# Patient Record
Sex: Female | Born: 1950 | Hispanic: No | Smoking: Former smoker
Health system: Southern US, Community
[De-identification: ages and names within clinical notes are randomized; demographics above are authoritative.]

## PROBLEM LIST (undated history)

## (undated) DIAGNOSIS — F419 Anxiety disorder, unspecified: Secondary | ICD-10-CM

## (undated) DIAGNOSIS — J449 Chronic obstructive pulmonary disease, unspecified: Secondary | ICD-10-CM

## (undated) DIAGNOSIS — R413 Other amnesia: Secondary | ICD-10-CM

## (undated) DIAGNOSIS — G309 Alzheimer's disease, unspecified: Principal | ICD-10-CM

## (undated) DIAGNOSIS — F0280 Dementia in other diseases classified elsewhere without behavioral disturbance: Principal | ICD-10-CM

## (undated) DIAGNOSIS — I10 Essential (primary) hypertension: Secondary | ICD-10-CM

## (undated) HISTORY — DX: Other amnesia: R41.3

## (undated) HISTORY — DX: Anxiety disorder, unspecified: F41.9

## (undated) HISTORY — PX: OTHER SURGICAL HISTORY: SHX169

## (undated) HISTORY — DX: Essential (primary) hypertension: I10

## (undated) HISTORY — DX: Chronic obstructive pulmonary disease, unspecified: J44.9

## (undated) HISTORY — DX: Dementia in other diseases classified elsewhere without behavioral disturbance: F02.80

## (undated) HISTORY — DX: Alzheimer's disease, unspecified: G30.9

---

## 1998-06-25 ENCOUNTER — Other Ambulatory Visit: Admission: RE | Admit: 1998-06-25 | Discharge: 1998-06-25 | Payer: Self-pay | Admitting: Obstetrics

## 1998-09-05 ENCOUNTER — Emergency Department (HOSPITAL_COMMUNITY): Admission: EM | Admit: 1998-09-05 | Discharge: 1998-09-05 | Payer: Self-pay | Admitting: Emergency Medicine

## 1998-09-05 ENCOUNTER — Encounter: Payer: Self-pay | Admitting: Emergency Medicine

## 1999-11-08 ENCOUNTER — Emergency Department (HOSPITAL_COMMUNITY): Admission: EM | Admit: 1999-11-08 | Discharge: 1999-11-08 | Payer: Self-pay | Admitting: Emergency Medicine

## 2000-09-14 ENCOUNTER — Other Ambulatory Visit: Admission: RE | Admit: 2000-09-14 | Discharge: 2000-09-14 | Payer: Self-pay | Admitting: Obstetrics

## 2000-09-30 ENCOUNTER — Ambulatory Visit (HOSPITAL_COMMUNITY): Admission: RE | Admit: 2000-09-30 | Discharge: 2000-09-30 | Payer: Self-pay | Admitting: Obstetrics

## 2000-09-30 ENCOUNTER — Encounter: Payer: Self-pay | Admitting: Obstetrics

## 2001-10-05 ENCOUNTER — Encounter: Payer: Self-pay | Admitting: Pulmonary Disease

## 2001-10-05 ENCOUNTER — Ambulatory Visit (HOSPITAL_COMMUNITY): Admission: RE | Admit: 2001-10-05 | Discharge: 2001-10-05 | Payer: Self-pay | Admitting: Pulmonary Disease

## 2003-12-09 ENCOUNTER — Emergency Department (HOSPITAL_COMMUNITY): Admission: EM | Admit: 2003-12-09 | Discharge: 2003-12-09 | Payer: Self-pay | Admitting: Family Medicine

## 2004-06-14 ENCOUNTER — Emergency Department (HOSPITAL_COMMUNITY): Admission: EM | Admit: 2004-06-14 | Discharge: 2004-06-14 | Payer: Self-pay | Admitting: Family Medicine

## 2004-08-20 ENCOUNTER — Ambulatory Visit (HOSPITAL_BASED_OUTPATIENT_CLINIC_OR_DEPARTMENT_OTHER): Admission: RE | Admit: 2004-08-20 | Discharge: 2004-08-20 | Payer: Self-pay | Admitting: *Deleted

## 2004-08-20 ENCOUNTER — Ambulatory Visit (HOSPITAL_COMMUNITY): Admission: RE | Admit: 2004-08-20 | Discharge: 2004-08-20 | Payer: Self-pay | Admitting: *Deleted

## 2004-10-15 ENCOUNTER — Ambulatory Visit (HOSPITAL_COMMUNITY): Admission: RE | Admit: 2004-10-15 | Discharge: 2004-10-15 | Payer: Self-pay | Admitting: *Deleted

## 2004-10-15 ENCOUNTER — Ambulatory Visit (HOSPITAL_BASED_OUTPATIENT_CLINIC_OR_DEPARTMENT_OTHER): Admission: RE | Admit: 2004-10-15 | Discharge: 2004-10-15 | Payer: Self-pay | Admitting: *Deleted

## 2004-12-24 ENCOUNTER — Ambulatory Visit (HOSPITAL_COMMUNITY): Admission: RE | Admit: 2004-12-24 | Discharge: 2004-12-24 | Payer: Self-pay | Admitting: Pulmonary Disease

## 2006-04-09 ENCOUNTER — Ambulatory Visit (HOSPITAL_COMMUNITY): Admission: RE | Admit: 2006-04-09 | Discharge: 2006-04-09 | Payer: Self-pay | Admitting: *Deleted

## 2006-04-09 ENCOUNTER — Encounter (INDEPENDENT_AMBULATORY_CARE_PROVIDER_SITE_OTHER): Payer: Self-pay | Admitting: Specialist

## 2006-07-03 ENCOUNTER — Emergency Department (HOSPITAL_COMMUNITY): Admission: EM | Admit: 2006-07-03 | Discharge: 2006-07-03 | Payer: Self-pay | Admitting: Emergency Medicine

## 2006-09-02 ENCOUNTER — Emergency Department (HOSPITAL_COMMUNITY): Admission: EM | Admit: 2006-09-02 | Discharge: 2006-09-02 | Payer: Self-pay | Admitting: Emergency Medicine

## 2008-12-04 ENCOUNTER — Ambulatory Visit (HOSPITAL_COMMUNITY): Admission: RE | Admit: 2008-12-04 | Discharge: 2008-12-04 | Payer: Self-pay | Admitting: Pulmonary Disease

## 2009-10-21 ENCOUNTER — Ambulatory Visit: Payer: Self-pay | Admitting: Internal Medicine

## 2009-10-21 ENCOUNTER — Encounter (INDEPENDENT_AMBULATORY_CARE_PROVIDER_SITE_OTHER): Payer: Self-pay | Admitting: Family Medicine

## 2009-10-21 LAB — CONVERTED CEMR LAB
Alkaline Phosphatase: 60 units/L (ref 39–117)
Basophils Relative: 0 % (ref 0–1)
Creatinine, Ser: 0.72 mg/dL (ref 0.40–1.20)
Eosinophils Absolute: 0 10*3/uL (ref 0.0–0.7)
Eosinophils Relative: 0 % (ref 0–5)
HCT: 38.7 % (ref 36.0–46.0)
Hemoglobin: 12.3 g/dL (ref 12.0–15.0)
Lymphocytes Relative: 48 % — ABNORMAL HIGH (ref 12–46)
MCV: 92.1 fL (ref 78.0–100.0)
Monocytes Relative: 6 % (ref 3–12)
Neutrophils Relative %: 45 % (ref 43–77)
Potassium: 3.7 meq/L (ref 3.5–5.3)
RBC: 4.2 M/uL (ref 3.87–5.11)
RDW: 13.9 % (ref 11.5–15.5)
Sodium: 141 meq/L (ref 135–145)
Total Protein: 7.8 g/dL (ref 6.0–8.3)

## 2009-10-31 ENCOUNTER — Ambulatory Visit (HOSPITAL_COMMUNITY): Admission: RE | Admit: 2009-10-31 | Discharge: 2009-10-31 | Payer: Self-pay | Admitting: Family Medicine

## 2009-11-15 ENCOUNTER — Ambulatory Visit: Payer: Self-pay | Admitting: Internal Medicine

## 2009-11-21 ENCOUNTER — Ambulatory Visit: Payer: Self-pay | Admitting: Internal Medicine

## 2010-01-28 ENCOUNTER — Ambulatory Visit: Payer: Self-pay | Admitting: Internal Medicine

## 2010-02-11 ENCOUNTER — Ambulatory Visit (HOSPITAL_COMMUNITY): Admission: RE | Admit: 2010-02-11 | Discharge: 2010-02-11 | Payer: Self-pay | Admitting: Internal Medicine

## 2010-05-26 ENCOUNTER — Encounter: Payer: Self-pay | Admitting: Pulmonary Disease

## 2010-05-29 ENCOUNTER — Encounter (INDEPENDENT_AMBULATORY_CARE_PROVIDER_SITE_OTHER): Payer: Self-pay | Admitting: Family Medicine

## 2010-05-29 LAB — CONVERTED CEMR LAB
ALT: 26 units/L (ref 0–35)
AST: 38 units/L — ABNORMAL HIGH (ref 0–37)
Albumin: 4.6 g/dL (ref 3.5–5.2)
BUN: 9 mg/dL (ref 6–23)
Calcium: 9.9 mg/dL (ref 8.4–10.5)
Cholesterol: 181 mg/dL (ref 0–200)
HDL: 73 mg/dL (ref >39)
LDL Cholesterol: 96 mg/dL (ref 0–99)
Triglycerides: 61 mg/dL (ref <150)
VLDL: 12 mg/dL (ref 0–40)

## 2010-09-19 NOTE — Op Note (Signed)
NAMESHATASIA, CUTSHAW                  ACCOUNT NO.:  [id number]   MEDICAL RECORD NO.:  [id number]          PATIENT TYPE:  OUT   LOCATION:  DFTL                         FACILITY:  [facility]   PHYSICIAN:  Tennis Must Meyerdierks, M.D.DATE OF BIRTH:  1950/09/10   DATE OF PROCEDURE:  08/20/2004  DATE OF DISCHARGE:  08/20/2004                                 OPERATIVE REPORT   PREOPERATIVE DIAGNOSIS:  Right carpal tunnel syndrome.   POSTOPERATIVE DIAGNOSIS:  Right carpal tunnel syndrome.   PROCEDURE:  Decompression median nerve, right carpal tunnel.   SURGEON:  Lowell Bouton, M.D.   ANESTHESIA:  Half percent Marcaine local with sedation.   OPERATIVE FINDINGS:  The patient had no masses in the carpal canal and the  motor branch of the nerve was intact.   PROCEDURE:  Under 0.5% Marcaine local anesthesia with a tourniquet on the  right arm, the right hand was prepped and draped in usual fashion.  After  exsanguinating the limb, the tourniquet was inflated to 250 mmHg. A 3 cm  longitudinal incision was made in the palm just ulnar to the thenar crease.  Sharp dissection was carried through the subcutaneous tissues. Blunt  dissection was carried through the superficial palmar fascia distal to the  transverse carpal ligament. Hemostat was then placed in the carpal canal up  against the hook of the hamate and the transverse carpal ligament was  divided on the ulnar border of median nerve. The proximal end of the  ligament was divided with the scissors after dissecting the nerve away from  the undersurface of the ligament. The carpal canal was then palpated and was  found to be adequately decompressed.  The nerve was examined and the motor  branch was identified. The wound was then irrigated with saline. The skin  was closed with 4-0 nylon suture. Sterile dressings were applied followed by  a volar wrist splint. The patient tolerated the procedure well and went to  the recovery room awake  and stable in good condition.      EMM/MEDQ  D:  09/12/2004  T:  09/12/2004  Job:  [id number]

## 2010-09-19 NOTE — Op Note (Signed)
[name], TUFT                  ACCOUNT NO.:  [id number]   MEDICAL RECORD NO.:  [id number]          PATIENT TYPE:  AMB   LOCATION:  DSC                          FACILITY:  [facility]   PHYSICIAN:  Lowell Bouton, M.D.DATE OF BIRTH:  03/01/1951   DATE OF PROCEDURE:  10/15/2004  DATE OF DISCHARGE:                                 OPERATIVE REPORT   PREOPERATIVE DIAGNOSIS:  Left carpal tunnel syndrome.   POSTOPERATIVE DIAGNOSIS:  Left carpal tunnel syndrome.   PROCEDURE:  Decompression median nerve, left carpal tunnel.   SURGEON:  Lowell Bouton, M.D.   ANESTHESIA:  0.5% Marcaine local with sedation.   OPERATIVE FINDINGS:  The patient had no masses in the carpal canal. The  motor branch of the nerve was intact.   PROCEDURE:  Under 1/2% Marcaine local anesthesia with a tourniquet on the  left arm, the left hand was prepped and draped in usual fashion.  After  exsanguinating the limb, the tourniquet was inflated to 250 mmHg. A 3 cm  longitudinal incision was made on the ulnar border of the thenar crease and  sharp dissection carried through the subcutaneous tissues. Blunt dissection  was carried through the superficial palmar fascia distal to the transverse  carpal ligament. Hemostat was then placed in the carpal canal up against the  hook of the hamate and the transverse carpal ligament was divided on the  ulnar border of the median nerve. The proximal end of the ligament was  divided with the scissors after dissecting the nerve away from the  undersurface of the ligament. The carpal canal was then palpated and was  found to be adequately decompressed.  The nerve was identified. The wound  was then irrigated with saline. The skin was closed with 4-0 nylon sutures.  Sterile dressings were applied followed by a volar wrist splint. The patient  tolerated the procedure well and went to the recovery room awake and stable  in good condition.       EMM/MEDQ  D:   10/15/2004  T:  10/15/2004  Job:  [id number]

## 2010-09-19 NOTE — Op Note (Signed)
[name], SLIGHT                  ACCOUNT NO.:  [id number]   MEDICAL RECORD NO.:  [id number]          PATIENT TYPE:  AMB   LOCATION:  ENDO                         FACILITY:  [facility]   PHYSICIAN:  Georgiana Spinner, M.D.    DATE OF BIRTH:  11-07-1950   DATE OF PROCEDURE:  04/09/2006  DATE OF DISCHARGE:                               OPERATIVE REPORT   PROCEDURE:  Colonoscopy.   INDICATIONS:  Colon polyps.   ANESTHESIA:  Demerol 20 mg, Versed 2.5 mg.   PROCEDURE:  With the patient mildly sedated in the left lateral  decubitus position, the Olympus videoscopic colonoscope was inserted  into the rectum and passed under direct vision to the cecum, identified  by ileocecal valve and crow's foot of the cecum, both of which were  photographed.  From this point, the colonoscope was slowly withdrawn  taking circumferential views of colonic mucosa, stopping near the  splenic flexure and the transverse colon where a polyp was seen,  photographed and removed using snare cautery technique in the setting of  20/200 blended current.  The polyp was suctioned through the endoscope  and retrieved.  The endoscope was then further withdrawn taking  circumferential views of remaining colonic mucosa, stopping only in the  rectum which appeared normal on direct and showed hemorrhoids on  retroflexed view.  The endoscope was straightened and withdrawn.  The  patient's vital signs and pulse oximeter remained stable.  The patient  tolerated the procedure well and without apparent complication.   FINDINGS:  Internal hemorrhoids.  Polyp of splenic flexure area.  Await  biopsy report.  The patient will call me for results and follow-up with  me as an outpatient.           ______________________________  Georgiana Spinner, M.D.     GMO/MEDQ  D:  04/09/2006  T:  04/09/2006  Job:  [id number]

## 2010-09-19 NOTE — Op Note (Signed)
[name], [name]                  ACCOUNT NO.:  [id number]   MEDICAL RECORD NO.:  [id number]          PATIENT TYPE:  AMB   LOCATION:  ENDO                         FACILITY:  [facility]   PHYSICIAN:  Georgiana Spinner, M.D.    DATE OF BIRTH:  18-Sep-1950   DATE OF PROCEDURE:  04/09/2006  DATE OF DISCHARGE:                               OPERATIVE REPORT   PROCEDURE:  Upper endoscopy.   ENDOSCOPIST:  Georgiana Spinner, M.D.   INDICATIONS:  History of chronic GERD.   ANESTHESIA:  Demerol 50 mg, Versed 5 mg.   PROCEDURE:  With the patient mildly sedated in the left lateral  decubitus position, the Olympus videoscopic endoscope was inserted in  the mouth and passed under direct vision into the esophagus, which  appeared normal.  There was no evidence of Barrett's esophagus or  esophagitis.  Photographs were taken.  We entered into the stomach;  fundus, body, antrum, duodenal bulb and second portion of duodenum were  visualized and all appeared unremarkable.  Subsequently, the endoscope  was slowly withdrawn, taking circumferential views of duodenal mucosa  until the endoscope had been pulled back into the stomach and placed on  retroflexion to view the stomach from below a loose wrap of the GE  junction around the endoscope was noted.  The endoscope was then  straightened and withdrawn, taking circumferential views of the  remaining gastric and esophageal mucosa.  The patient's vital signs and  pulse oximetry remained stable.  The patient tolerated the procedure  well without apparent complications.   FINDINGS:  Loose wrap of the GE junction around the endoscope, otherwise  an unremarkable examination.   PLAN:  Proceed to colonoscopy.           ______________________________  Georgiana Spinner, M.D.     GMO/MEDQ  D:  04/09/2006  T:  04/09/2006  Job:  [id number]   cc:   Mina Marble, M.D.

## 2010-09-19 NOTE — Op Note (Signed)
[name], [name]                  ACCOUNT NO.:  [id number]   MEDICAL RECORD NO.:  [id number]          PATIENT TYPE:  AMB   LOCATION:  DSC                          FACILITY:  [facility]   PHYSICIAN:  Tennis Must Meyerdierks, M.D.DATE OF BIRTH:  06-06-1950   DATE OF PROCEDURE:  DATE OF DISCHARGE:                                 OPERATIVE REPORT   PREOPERATIVE DIAGNOSIS:  Right carpal tunnel syndrome.   POSTOPERATIVE DIAGNOSIS:  Right carpal tunnel syndrome.   PROCEDURE:  Decompression median nerve right carpal tunnel.   SURGEON:  Lowell Bouton, M.D.   ANESTHESIA:  Marcaine 0.5% local with sedation.   OPERATIVE FINDINGS:  The patient had no masses in the carpal canal. The  motor branch of the nerve was intact.   PROCEDURE:  Under 0.5% Marcaine local anesthesia with a tourniquet on the  right arm, the right hand was prepped and draped in the usual fashion and  after explaining the limb, the tourniquet was inflated to 250 mmHg. A 3 cm  longitudinal incision was made in the palm just ulnar to the thenar crease.  Sharp dissection was carried through the subcutaneous tissues. Blunt  dissection was carried through the superficial palmar fascia distal to the  transverse carpal ligament. Hemostat was then placed in the carpal canal up  against the hook of the hamate and the transverse carpal ligament was  divided on the ulnar border of the median nerve. The proximal end of the  ligament was divided with the scissors after dissecting the nerve away from  the undersurface of the ligament. The carpal canal was then palpated and was  found to be adequately decompressed.  The nerve was examined and the motor  branch was identified. The wound was then irrigated with saline. The skin  was closed with 4-0 nylon sutures. Sterile dressings were applied followed  by a volar wrist splint. The patient tolerated the procedure well went to  the recovery room awake and stable condition.      EMM/MEDQ  D:  08/20/2004  T:  08/20/2004  Job:  [id number]   cc:   Eino Farber., M.D.  [address] [place]  [place] [address]  Fax: [phone]

## 2010-12-10 ENCOUNTER — Other Ambulatory Visit: Payer: Self-pay | Admitting: Internal Medicine

## 2010-12-10 DIAGNOSIS — K219 Gastro-esophageal reflux disease without esophagitis: Secondary | ICD-10-CM

## 2010-12-11 ENCOUNTER — Ambulatory Visit
Admission: RE | Admit: 2010-12-11 | Discharge: 2010-12-11 | Disposition: A | Discharge status: Home / Self Care | Payer: Medicare Other | Source: Ambulatory Visit | Attending: Internal Medicine | Admitting: Internal Medicine

## 2010-12-11 DIAGNOSIS — K219 Gastro-esophageal reflux disease without esophagitis: Secondary | ICD-10-CM

## 2010-12-30 ENCOUNTER — Other Ambulatory Visit: Payer: Self-pay | Admitting: Gastroenterology

## 2011-02-02 ENCOUNTER — Other Ambulatory Visit (HOSPITAL_COMMUNITY): Payer: Self-pay | Admitting: Internal Medicine

## 2011-02-02 DIAGNOSIS — Z1231 Encounter for screening mammogram for malignant neoplasm of breast: Secondary | ICD-10-CM

## 2011-02-13 ENCOUNTER — Ambulatory Visit (HOSPITAL_COMMUNITY)
Admission: RE | Admit: 2011-02-13 | Discharge: 2011-02-13 | Disposition: A | Discharge status: Home / Self Care | Payer: Medicare Other | Source: Ambulatory Visit | Attending: Internal Medicine | Admitting: Internal Medicine

## 2011-02-13 DIAGNOSIS — Z1231 Encounter for screening mammogram for malignant neoplasm of breast: Secondary | ICD-10-CM | POA: Insufficient documentation

## 2012-01-06 ENCOUNTER — Other Ambulatory Visit (HOSPITAL_COMMUNITY): Payer: Self-pay | Admitting: Internal Medicine

## 2012-01-06 DIAGNOSIS — Z1231 Encounter for screening mammogram for malignant neoplasm of breast: Secondary | ICD-10-CM

## 2012-01-08 ENCOUNTER — Other Ambulatory Visit: Payer: Self-pay | Admitting: Neurology

## 2012-01-08 DIAGNOSIS — F09 Unspecified mental disorder due to known physiological condition: Secondary | ICD-10-CM

## 2012-01-08 DIAGNOSIS — R6889 Other general symptoms and signs: Secondary | ICD-10-CM

## 2012-02-05 ENCOUNTER — Ambulatory Visit
Admission: RE | Admit: 2012-02-05 | Discharge: 2012-02-05 | Disposition: A | Discharge status: Home / Self Care | Payer: Medicare Other | Source: Ambulatory Visit | Attending: Neurology | Admitting: Neurology

## 2012-02-05 DIAGNOSIS — R6889 Other general symptoms and signs: Secondary | ICD-10-CM

## 2012-02-05 DIAGNOSIS — F09 Unspecified mental disorder due to known physiological condition: Secondary | ICD-10-CM

## 2012-02-17 ENCOUNTER — Ambulatory Visit (HOSPITAL_COMMUNITY)
Admission: RE | Admit: 2012-02-17 | Discharge: 2012-02-17 | Disposition: A | Discharge status: Home / Self Care | Payer: Medicare Other | Source: Ambulatory Visit | Attending: Internal Medicine | Admitting: Internal Medicine

## 2012-02-17 DIAGNOSIS — Z1231 Encounter for screening mammogram for malignant neoplasm of breast: Secondary | ICD-10-CM | POA: Insufficient documentation

## 2012-12-23 ENCOUNTER — Ambulatory Visit: Payer: Self-pay | Admitting: Nurse Practitioner

## 2012-12-24 ENCOUNTER — Encounter: Payer: Self-pay | Admitting: Nurse Practitioner

## 2012-12-26 ENCOUNTER — Ambulatory Visit (INDEPENDENT_AMBULATORY_CARE_PROVIDER_SITE_OTHER): Payer: Medicare Other | Admitting: Nurse Practitioner

## 2012-12-26 ENCOUNTER — Encounter: Payer: Self-pay | Admitting: Nurse Practitioner

## 2012-12-26 VITALS — BP 146/73 | HR 67 | Ht 65.0 in | Wt 168.0 lb

## 2012-12-26 DIAGNOSIS — F079 Unspecified personality and behavioral disorder due to known physiological condition: Secondary | ICD-10-CM

## 2012-12-26 NOTE — Patient Instructions (Addendum)
Continue Aricept 10 mg daily  Follow up in 6 months

## 2012-12-26 NOTE — Progress Notes (Signed)
Reason for visit follows up for memory loss HPI:  [name] is a 62 year old right-handed black female with a four year history of a progressive memory decline. Three years ago, the patient began having problems with getting lost while driving, and she had to give up operating a motor vehicle. The patient also began having problems with paying her bills, and her son had to take this task over. Since that time, the patient has moved into an independent living facility. The patient takes care of her autistic son. The patient has had increasing problems with managing day-to-day activities. The patient requires assistance to keep up with medications, and appointments. The son has to call every 3 hours to check on her and to get her to take her medications.  The patient notes some mild gait instability, with 1 fall in last 6 months. The patient does not use a cane for ambulation. The patient does not report headaches or visual field changes. The patient has been placed on Aricept without GI complaints Thyroid studies and vitamin B12 levels are unremarkable. Son says he thinks her memory has stabilized for now. He takes care of her finances. She requires assistance to keep up with medications, and appointments.No focal weakness, no problems controlling the bowels , has urinary incontinance.  CT of the head 02/05/12 was normal. Patient does not drive. Appetite is good she receives Meals on Wheels. Sleeping well at night without behavior issues or wandering.    ROS:  Memory loss, urinary incontinence, allergies  Medications Current Outpatient Prescriptions on File Prior to Visit  Medication Sig Dispense Refill  . albuterol (PROAIR HFA) 108 (90 BASE) MCG/ACT inhaler Inhale 2 puffs into the lungs every 6 (six) hours as needed for wheezing.      Marland Kitchen albuterol (PROVENTIL) (2.5 MG/3ML) 0.083% nebulizer solution Take 2.5 mg by nebulization every 6 (six) hours as needed for wheezing.      . Cetirizine HCl (ZYRTEC  ALLERGY) 10 MG CAPS Take 10 mg by mouth daily.      Marland Kitchen dexlansoprazole (DEXILANT) 60 MG capsule Take 60 mg by mouth daily.      Marland Kitchen dicyclomine (BENTYL) 20 MG tablet Take 20 mg by mouth 3 (three) times daily.      Marland Kitchen donepezil (ARICEPT) 10 MG tablet Take 10 mg by mouth daily.      . Ipratropium-Albuterol (COMBIVENT RESPIMAT) 20-100 MCG/ACT AERS respimat Inhale 2 puffs into the lungs every 6 (six) hours.      . sucralfate (CARAFATE) 1 G tablet Take 1 g by mouth 3 (three) times daily.      . traMADol (ULTRAM) 50 MG tablet Take 50 mg by mouth every 6 (six) hours as needed for pain.       No current facility-administered medications on file prior to visit.    Allergies No Known Allergies  Physical Exam General: well developed, well nourished, seated, in no evident distress  Neurologic Exam Mental Status: Awake and  alert. MMSE 16/30.AFT 4.  Follows all commands. Speech and language normal.   Cranial Nerves: Fundoscopic exam reveals flat  discs. Pupils equal, briskly reactive to light. Extraocular movements full without nystagmus. Visual fields full to confrontation. Hearing intact and symmetric to finger snap. Facial sensation intact. Face, tongue, palate move normally and symmetrically. Neck flexion and extension normal.  Motor: Normal bulk and tone. Normal strength in all tested extremity muscles.No focal weakness Sensory.: intact to touch and pinprick and vibratory.  Coordination: Clumsy finger to nose and heel to  shin bilaterally. Some apraxia of the lower extremities  Gait and Station: Arises from chair without difficulty. Stance is normal.  Able to heel, toe and mildly unsteady with tandem walk.  Reflexes: 2+ and symmetric. Toes downgoing.     ASSESSMENT: Progressive dementing illness currently  on Aricept without side effects     PLAN: Continue Aricept 10 mg daily, does not need refills according to son Followup in 6 months  [name], [facility] APRN

## 2012-12-26 NOTE — Progress Notes (Signed)
I have read the note, and I agree with the clinical assessment and plan.  [name],[name]   

## 2013-01-18 ENCOUNTER — Other Ambulatory Visit (HOSPITAL_COMMUNITY): Payer: Self-pay | Admitting: Internal Medicine

## 2013-01-18 DIAGNOSIS — Z1231 Encounter for screening mammogram for malignant neoplasm of breast: Secondary | ICD-10-CM

## 2013-02-20 ENCOUNTER — Ambulatory Visit (HOSPITAL_COMMUNITY)
Admission: RE | Admit: 2013-02-20 | Discharge: 2013-02-20 | Disposition: A | Discharge status: Home / Self Care | Payer: Medicare Other | Source: Ambulatory Visit | Attending: Internal Medicine | Admitting: Internal Medicine

## 2013-02-20 DIAGNOSIS — Z1231 Encounter for screening mammogram for malignant neoplasm of breast: Secondary | ICD-10-CM | POA: Insufficient documentation

## 2013-06-28 ENCOUNTER — Ambulatory Visit (INDEPENDENT_AMBULATORY_CARE_PROVIDER_SITE_OTHER): Payer: Medicare Other | Admitting: Nurse Practitioner

## 2013-06-28 ENCOUNTER — Encounter (INDEPENDENT_AMBULATORY_CARE_PROVIDER_SITE_OTHER): Payer: Self-pay

## 2013-06-28 ENCOUNTER — Encounter: Payer: Self-pay | Admitting: Nurse Practitioner

## 2013-06-28 VITALS — BP 160/86 | HR 76 | Ht 65.0 in | Wt 162.0 lb

## 2013-06-28 DIAGNOSIS — F079 Unspecified personality and behavioral disorder due to known physiological condition: Secondary | ICD-10-CM

## 2013-06-28 NOTE — Progress Notes (Signed)
I have read the note, and I agree with the clinical assessment and plan.  WILLIS,CHARLES KEITH   

## 2013-06-28 NOTE — Patient Instructions (Signed)
Continue Aricept 10mg  daily Follow up in 6 to 8 months

## 2013-06-28 NOTE — Progress Notes (Signed)
[facility]  PATIENT: [name] DOB: [birth year]   REASON FOR VISIT: Followup for memory loss   HISTORY OF PRESENT ILLNESS: [name], 63 year old female returns for followup with her son. She has dementia for 4-1/2 years. She can no longer pay her bills, get her meals, management of day-to-day activities. She requires assistance with medication administration. There is no focal weakness, no difficulty controlling the bowels however she does have urinary incontinence. She receives Meals on Wheels. There have been no behavior issues or wandering behavior according to the son. She sleeps well at night. She is currently on Aricept without side effects. She returns for reevaluation.   HISTORY: four year history of a progressive memory decline. Three years ago, the patient began having problems with getting lost while driving, and she had to give up operating a motor vehicle. The patient also began having problems with paying her bills, and her son had to take this task over. Since that time, the patient has moved into an independent living facility. The patient takes care of her autistic son. The patient has had increasing problems with managing day-to-day activities. The patient requires assistance to keep up with medications, and appointments. The son has to call every 3 hours to check on her and to get her to take her medications. The patient notes some mild gait instability, with 1 fall in last 6 months. The patient does not use a cane for ambulation. The patient does not report headaches or visual field changes. The patient has been placed on Aricept without GI complaints Thyroid studies and vitamin B12 levels are unremarkable.  Son says he thinks her memory has stabilized for now. He takes care of her finances. She requires assistance to keep up with medications, and appointments.No focal weakness, no problems controlling the bowels , has urinary incontinance. CT of the head  02/05/12 was normal. Patient does not drive. Appetite is good she receives Meals on Wheels. Sleeping well at night without behavior issues or wandering.      REVIEW OF SYSTEMS: Full 14 system review of systems performed and notable only for those listed, all others are neg:  Constitutional: N/A  Cardiovascular: N/A  Ear/Nose/Throat: N/A  Skin: N/A  Eyes: N/A  Respiratory: N/A  Gastroitestinal: N/A  Hematology/Lymphatic: N/A  Endocrine: N/A Musculoskeletal: 1 fall in 6 months Allergy/Immunology: N/A  Neurological: Memory loss Psychiatric: Confusion   ALLERGIES: No Known Allergies  HOME MEDICATIONS: Outpatient Prescriptions Prior to Visit  Medication Sig Dispense Refill  . albuterol (PROAIR HFA) 108 (90 BASE) MCG/ACT inhaler Inhale 2 puffs into the lungs every 6 (six) hours as needed for wheezing.      Marland Kitchen albuterol (PROVENTIL) (2.5 MG/3ML) 0.083% nebulizer solution Take 2.5 mg by nebulization every 6 (six) hours as needed for wheezing.      . Cetirizine HCl (ZYRTEC ALLERGY) 10 MG CAPS Take 10 mg by mouth daily.      Marland Kitchen dexlansoprazole (DEXILANT) 60 MG capsule Take 60 mg by mouth daily.      Marland Kitchen dicyclomine (BENTYL) 20 MG tablet Take 20 mg by mouth 3 (three) times daily.      Marland Kitchen donepezil (ARICEPT) 10 MG tablet Take 10 mg by mouth daily.      . Ipratropium-Albuterol (COMBIVENT RESPIMAT) 20-100 MCG/ACT AERS respimat Inhale 2 puffs into the lungs every 6 (six) hours.      . sucralfate (CARAFATE) 1 G tablet Take 1 g by mouth 3 (three) times daily.      Marland Kitchen  traMADol (ULTRAM) 50 MG tablet Take 50 mg by mouth every 6 (six) hours as needed for pain.       No facility-administered medications prior to visit.    PAST MEDICAL HISTORY: Past Medical History  Diagnosis Date  . Memory disturbance   . Hypertension   . Anxiety disorder   . COPD (chronic obstructive pulmonary disease)     PAST SURGICAL HISTORY: Past Surgical History  Procedure Laterality Date  . None      FAMILY  HISTORY: Family History  Problem Relation Age of Onset  . Alzheimer's disease Brother     SOCIAL HISTORY: History   Social History  . Marital Status: Divorced    Spouse Name: N/A    Number of Children: 2  . Years of Education: GED   Occupational History  . Not on file.   Social History Main Topics  . Smoking status: Former Games developermoker  . Smokeless tobacco: Never Used  . Alcohol Use: No  . Drug Use: No  . Sexual Activity: Not on file   Other Topics Concern  . Not on file   Social History Narrative   Patient is divorced and lives in an independence living with her son who has autism.    Patient has her GED.    Patient has 2 sons.    Patient quit smoking in 1990.           PHYSICAL EXAM  Filed Vitals:   06/28/13 1105  BP: 160/86  Pulse: 76  Height: 5\' 5"  (1.651 m)  Weight: 162 lb (73.483 kg)   Body mass index is 26.96 kg/(m^2).  Generalized: Well developed, in no acute distress   Neurological examination   Mentation: Alert  MMSE 15/30 missing items and orientation, calculation, 3 out of 3 recall, unable to copy a figure or write a sentence. AFT 2. Follows some commands.   Cranial nerve II-XII: Pupils were equal round reactive to light extraocular movements were full, visual field were full on confrontational test. Facial sensation and strength were normal. hearing was intact to finger rubbing bilaterally. Uvula tongue midline. head turning and shoulder shrug were normal and symmetric.Tongue protrusion into cheek strength was normal. Motor: normal bulk and tone, full strength in the BUE, BLE, fine finger movements normal, no pronator drift. No focal weakness Sensory: normal and symmetric to light touch, pinprick, and  vibration  Coordination: Clumsy finger-nose-finger, heel-to-shin bilaterally, some apraxia of the lower extremities noted Reflexes: Brachioradialis 2/2, biceps 2/2, triceps 2/2, patellar 2/2, Achilles 2/2, plantar responses were flexor  bilaterally. Gait and Station: Rising up from seated position without assistance,wide based  smooth turning, unable to perform tiptoe, and heel walk. Tandem gait is unsteady  DIAGNOSTIC DATA (LABS, IMAGING, TESTING) -     ASSESSMENT AND PLAN  63 y.o. year old female  has a past medical history of Memory disturbance; Hypertension; Anxiety disorder; and COPD (chronic obstructive pulmonary disease). here in followup for dementia which is progressive. She is currently on Aricept about side effects  Continue Aricept 10mg  daily, does not need refills according to son Follow up in 6 to 8 months Nilda RiggsNancy Carolyn Jatavion Peaster, [facility] CenterGNP, [facility], APRN  [facility] [address], [address] [place], [place] [address] [phone]

## 2013-12-06 ENCOUNTER — Other Ambulatory Visit (HOSPITAL_COMMUNITY): Payer: Self-pay | Admitting: Family Medicine

## 2013-12-06 DIAGNOSIS — Z1231 Encounter for screening mammogram for malignant neoplasm of breast: Secondary | ICD-10-CM

## 2014-01-01 ENCOUNTER — Ambulatory Visit (INDEPENDENT_AMBULATORY_CARE_PROVIDER_SITE_OTHER): Payer: Medicare (Managed Care) | Admitting: Neurology

## 2014-01-01 ENCOUNTER — Encounter: Payer: Self-pay | Admitting: Neurology

## 2014-01-01 VITALS — BP 151/85 | HR 81 | Wt 155.0 lb

## 2014-01-01 DIAGNOSIS — G309 Alzheimer's disease, unspecified: Principal | ICD-10-CM

## 2014-01-01 DIAGNOSIS — F0280 Dementia in other diseases classified elsewhere without behavioral disturbance: Secondary | ICD-10-CM

## 2014-01-01 HISTORY — DX: Alzheimer's disease, unspecified: F02.80

## 2014-01-01 NOTE — Patient Instructions (Signed)
Alzheimer Disease Caregiver Guide Alzheimer disease is an illness that affects a person's brain. It causes a person to lose the ability to remember things and make good decisions. As the disease progresses, the person is unable to take care of himself or herself and needs more and more help to do simple tasks. Taking care of someone with Alzheimer disease can be very challenging and overwhelming.  MEMORY LOSS AND CONFUSION Memory loss and confusion is mild in the beginning stages of the disease. Both of these problems become more severe as the disease progresses. Eventually, the person will not recognize places or even close family members and friends.   Stay calm.  Respond with a short explanation. Long explanations can be overwhelming and confusing.  Avoid corrections that sound like scolding.  Try not to take it personally, even if the person forgets your name. BEHAVIOR CHANGES Behavior changes are part of the disease. The person may develop depression, anxiety, anger, hallucinations, or other behavior changes. These changes can come on suddenly and may be in response to pain, infection, changes in the environment (temperature, noise), overstimulation, or feeling lost or scared.   Try not to take behavior changes personally.  Remain calm and patient.  Do not argue or try to convince the person about a specific point. This will only make him or her more agitated.  Know that the behavior changes are part of the disease process and try to work through it. TIPS TO REDUCE FRUSTRATION  Schedule wisely by making appointments and doing daily tasks, like bathing and dressing, when the person is at his or her best.  Take your time. Simple tasks may take a lot longer, so be sure to allow for plenty of time.  Limit choices. Too many choices can be overwhelming and stressful for the person.  Involve the person in what you are doing.  Stick to a routine.  Avoid new or crowded situations, if  possible.  Use simple words, short sentences, and a calm voice. Only give one direction at a time.  Buy clothes and shoes that are easy to put on and take off.  Let people help if they offer. HOME SAFETY Keeping the home safe is very important to reduce the risk of falls and injuries.   Keep floors clear of clutter. Remove rugs, magazine racks, and floor lamps.  Keep hallways well lit.  Put a handrail and nonslip mat in the bathtub or shower.  Put childproof locks on cabinets with dangerous items, such as medicine, alcohol, guns, toxic cleaning items, sharp tools or utensils, matches, or lighters.  Place locks on doors where the person cannot easily see or reach them. This helps ensure that the person cannot wander out of the house and get lost.  Be prepared for emergencies. Keep a list of emergency phone numbers and addresses in a convenient area. PLANS FOR THE FUTURE  Do not put off talking about finances.  Talk about money management. People with Alzheimer disease have trouble managing their money as the disease gets worse.  Get help from professional advisors regarding financial and legal matters.  Do not put off talking about future care.  Choose a power of attorney. This is someone who can make decisions for the person with Alzheimer disease when he or she is no longer able to do so.  Talk about driving and when it is the right time to stop. The person's health care provider can help give advice on this matter.  Talk about   the person's living situation. If he or she lives alone, you need to make sure he or she is safe. Some people need extra help at home, and others need more care at a nursing home or care center. SUPPORT GROUPS Joining a support group can be very helpful for caregivers of people with Alzheimer disease. Some advantages to being part of a support group include:   Getting strategies to manage stress.  Sharing experiences with others.  Receiving  emotional comfort and support.  Learning new caregiving skills as the disease progresses.  Knowing what community resources are available and taking advantage of them. SEEK MEDICAL CARE IF:  The person has a fever.  The person has a sudden change in behavior that does not improve with calming strategies.  The person is unable to manage in his or her current living situation.  The person threatens you or anyone else, including himself or herself.  You are no longer able to care for the person. Document Released: 12/31/2003 Document Revised: 09/04/2013 Document Reviewed: 05/27/2011 ExitCare Patient Information 2015 [employer]. This information is not intended to replace advice given to you by your health care provider. Make sure you discuss any questions you have with your health care provider.  

## 2014-01-01 NOTE — Progress Notes (Signed)
Reason for visit: Dementia  [name] is an 63 y.o. female  History of present illness:  [name] is a 63 year old right-handed white female with a history of a progressive dementia consistent with Alzheimer's disease. The patient is currently on Aricept and Namenda. Over the last 6 months, she has lost about 10 pounds, but the weight has stabilized on Remeron. The patient is requiring assistance with bathing and dressing, but she can feed herself. She is involved with the [facility] program, and this has helped her son. Her verbal output has dropped off some. The patient is not having problems with agitation, and she is sleeping well at night. She returns to this office for an evaluation. She lives with her son, and other family members help out taking care of her. The patient is able to indicate when she needs to use the bathroom, but occasionally there are some episodes of incontinence.  Past Medical History  Diagnosis Date  . Memory disturbance   . Hypertension   . Anxiety disorder   . COPD (chronic obstructive pulmonary disease)   . Alzheimer's disease 01/01/2014    Past Surgical History  Procedure Laterality Date  . None      Family History  Problem Relation Age of Onset  . Alzheimer's disease Brother     Social history:  reports that she has quit smoking. She has never used smokeless tobacco. She reports that she does not drink alcohol or use illicit drugs.   No Known Allergies  Medications:  Current Outpatient Prescriptions on File Prior to Visit  Medication Sig Dispense Refill  . albuterol (PROVENTIL) (2.5 MG/3ML) 0.083% nebulizer solution Take 2.5 mg by nebulization every 6 (six) hours as needed for wheezing.      . Cetirizine HCl (ZYRTEC ALLERGY) 10 MG CAPS Take 10 mg by mouth daily.      Marland Kitchen dexlansoprazole (DEXILANT) 60 MG capsule Take 60 mg by mouth daily.      Marland Kitchen donepezil (ARICEPT) 10 MG tablet Take 10 mg by mouth daily.      Marland Kitchen FLUOCINOLONE ACETONIDE BODY 0.01 %  OIL       . sucralfate (CARAFATE) 1 G tablet Take 1 g by mouth 3 (three) times daily.      . traMADol (ULTRAM) 50 MG tablet Take 50 mg by mouth every 6 (six) hours as needed for pain.       No current facility-administered medications on file prior to visit.    ROS:  Out of a complete 14 system review of symptoms, the patient complains only of the following symptoms, and all other reviewed systems are negative.  Activity change, weight loss Runny nose Walking difficulty Memory loss Confusion  Blood pressure 151/85, pulse 81, weight 155 lb (70.308 kg).  Physical Exam  General: The patient is alert and cooperative at the time of the examination.  Skin: No significant peripheral edema is noted.   Neurologic Exam  Mental status: The Mini-Mental status examination done today shows a total score of 9/30.  Cranial nerves: Facial symmetry is present. Speech is normal, no aphasia or dysarthria is noted. Extraocular movements are difficult to test, the patient will not track objects well. Visual fields are full by threat.  Motor: The patient has good strength in all 4 extremities.  Sensory examination: Soft touch sensation appears to be symmetric.  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  Gait and station: The patient has a normal gait. Tandem  gait was not tested. Romberg is negative. No drift is seen.  Reflexes: Deep tendon reflexes are symmetric.   Assessment/Plan:  One. Alzheimer's disease  The patient will continue on Namenda and Aricept for now, she continues to progress with her memory. She will followup in about 9 months. Fortunately, the patient has not been a management problem at home, sleeping well at night, and without agitation during the day.  Marlan Palau MD 01/01/2014 7:20 PM  [facility] [address] [place], [place] [address]  Phone [phone] Fax [phone]

## 2014-02-21 ENCOUNTER — Other Ambulatory Visit (HOSPITAL_COMMUNITY): Payer: Self-pay | Admitting: Family Medicine

## 2014-02-21 ENCOUNTER — Ambulatory Visit (HOSPITAL_COMMUNITY)
Admission: RE | Admit: 2014-02-21 | Discharge: 2014-02-21 | Disposition: A | Discharge status: Home / Self Care | Payer: Medicare (Managed Care) | Source: Ambulatory Visit | Attending: Family Medicine | Admitting: Family Medicine

## 2014-02-21 DIAGNOSIS — Z1231 Encounter for screening mammogram for malignant neoplasm of breast: Secondary | ICD-10-CM

## 2014-03-21 ENCOUNTER — Encounter: Payer: Self-pay | Admitting: Neurology

## 2014-03-27 ENCOUNTER — Encounter: Payer: Self-pay | Admitting: Neurology

## 2014-10-02 ENCOUNTER — Ambulatory Visit: Payer: Medicare (Managed Care) | Admitting: Neurology

## 2014-10-02 ENCOUNTER — Ambulatory Visit: Payer: Medicare (Managed Care) | Admitting: Nurse Practitioner

## 2015-02-13 ENCOUNTER — Other Ambulatory Visit: Payer: Self-pay

## 2015-02-13 DIAGNOSIS — Z1231 Encounter for screening mammogram for malignant neoplasm of breast: Secondary | ICD-10-CM

## 2015-02-27 ENCOUNTER — Ambulatory Visit: Payer: Medicare (Managed Care)

## 2015-04-03 ENCOUNTER — Ambulatory Visit
Admission: RE | Admit: 2015-04-03 | Discharge: 2015-04-03 | Disposition: A | Discharge status: Home / Self Care | Payer: Medicare (Managed Care) | Source: Ambulatory Visit

## 2015-04-03 DIAGNOSIS — Z1231 Encounter for screening mammogram for malignant neoplasm of breast: Secondary | ICD-10-CM

## 2016-06-30 ENCOUNTER — Encounter (HOSPITAL_COMMUNITY): Payer: Self-pay | Admitting: Emergency Medicine

## 2016-06-30 ENCOUNTER — Emergency Department (HOSPITAL_COMMUNITY): Payer: Medicare (Managed Care)

## 2016-06-30 ENCOUNTER — Inpatient Hospital Stay (HOSPITAL_COMMUNITY): Payer: Medicare (Managed Care)

## 2016-06-30 ENCOUNTER — Inpatient Hospital Stay (HOSPITAL_COMMUNITY)
Admission: EM | Admit: 2016-06-30 | Discharge: 2016-07-02 | DRG: 469 | Disposition: A | Discharge status: Skilled Nursing Facility | Payer: Medicare (Managed Care) | Attending: Internal Medicine | Admitting: Internal Medicine

## 2016-06-30 DIAGNOSIS — Y92129 Unspecified place in nursing home as the place of occurrence of the external cause: Secondary | ICD-10-CM

## 2016-06-30 DIAGNOSIS — R32 Unspecified urinary incontinence: Secondary | ICD-10-CM | POA: Diagnosis present

## 2016-06-30 DIAGNOSIS — K219 Gastro-esophageal reflux disease without esophagitis: Secondary | ICD-10-CM | POA: Diagnosis present

## 2016-06-30 DIAGNOSIS — R296 Repeated falls: Secondary | ICD-10-CM | POA: Diagnosis present

## 2016-06-30 DIAGNOSIS — J449 Chronic obstructive pulmonary disease, unspecified: Secondary | ICD-10-CM | POA: Diagnosis present

## 2016-06-30 DIAGNOSIS — F0280 Dementia in other diseases classified elsewhere without behavioral disturbance: Secondary | ICD-10-CM | POA: Diagnosis present

## 2016-06-30 DIAGNOSIS — W19XXXA Unspecified fall, initial encounter: Secondary | ICD-10-CM | POA: Diagnosis present

## 2016-06-30 DIAGNOSIS — S72009A Fracture of unspecified part of neck of unspecified femur, initial encounter for closed fracture: Secondary | ICD-10-CM | POA: Diagnosis present

## 2016-06-30 DIAGNOSIS — G9340 Encephalopathy, unspecified: Secondary | ICD-10-CM | POA: Diagnosis present

## 2016-06-30 DIAGNOSIS — S72011A Unspecified intracapsular fracture of right femur, initial encounter for closed fracture: Principal | ICD-10-CM | POA: Diagnosis present

## 2016-06-30 DIAGNOSIS — G309 Alzheimer's disease, unspecified: Secondary | ICD-10-CM | POA: Diagnosis present

## 2016-06-30 DIAGNOSIS — D62 Acute posthemorrhagic anemia: Secondary | ICD-10-CM | POA: Diagnosis not present

## 2016-06-30 DIAGNOSIS — Z87891 Personal history of nicotine dependence: Secondary | ICD-10-CM | POA: Diagnosis not present

## 2016-06-30 DIAGNOSIS — I10 Essential (primary) hypertension: Secondary | ICD-10-CM | POA: Diagnosis present

## 2016-06-30 DIAGNOSIS — Z79899 Other long term (current) drug therapy: Secondary | ICD-10-CM | POA: Diagnosis not present

## 2016-06-30 DIAGNOSIS — Z419 Encounter for procedure for purposes other than remedying health state, unspecified: Secondary | ICD-10-CM

## 2016-06-30 DIAGNOSIS — S72001A Fracture of unspecified part of neck of right femur, initial encounter for closed fracture: Secondary | ICD-10-CM | POA: Diagnosis not present

## 2016-06-30 LAB — URINALYSIS, ROUTINE W REFLEX MICROSCOPIC
BACTERIA UA: NONE SEEN
BILIRUBIN URINE: NEGATIVE
Glucose, UA: NEGATIVE mg/dL
Ketones, ur: NEGATIVE mg/dL
LEUKOCYTES UA: NEGATIVE
NITRITE: NEGATIVE
Protein, ur: NEGATIVE mg/dL
SPECIFIC GRAVITY, URINE: 1.021 (ref 1.005–1.030)
pH: 6 (ref 5.0–8.0)

## 2016-06-30 LAB — CBC WITH DIFFERENTIAL/PLATELET
BASOS PCT: 0 %
Basophils Absolute: 0 10*3/uL (ref 0.0–0.1)
Eosinophils Absolute: 0 10*3/uL (ref 0.0–0.7)
Eosinophils Relative: 0 %
HCT: 37.9 % (ref 36.0–46.0)
Hemoglobin: 12.3 g/dL (ref 12.0–15.0)
LYMPHS PCT: 4 %
Lymphs Abs: 0.5 10*3/uL — ABNORMAL LOW (ref 0.7–4.0)
MCH: 26.9 pg (ref 26.0–34.0)
MCHC: 32.5 g/dL (ref 30.0–36.0)
MCV: 82.9 fL (ref 78.0–100.0)
MONO ABS: 0.5 10*3/uL (ref 0.1–1.0)
MONOS PCT: 4 %
Neutro Abs: 11.4 10*3/uL — ABNORMAL HIGH (ref 1.7–7.7)
Neutrophils Relative %: 92 %
Platelets: 231 10*3/uL (ref 150–400)
RBC: 4.57 MIL/uL (ref 3.87–5.11)
RDW: 15 % (ref 11.5–15.5)
WBC: 12.4 10*3/uL — ABNORMAL HIGH (ref 4.0–10.5)

## 2016-06-30 LAB — BASIC METABOLIC PANEL
Anion gap: 7 (ref 5–15)
BUN: 15 mg/dL (ref 6–20)
CALCIUM: 9.4 mg/dL (ref 8.9–10.3)
CO2: 28 mmol/L (ref 22–32)
Chloride: 103 mmol/L (ref 101–111)
Creatinine, Ser: 0.68 mg/dL (ref 0.44–1.00)
GFR calc Af Amer: >60 mL/min (ref >60)
GFR calc non Af Amer: >60 mL/min (ref >60)
GLUCOSE: 165 mg/dL — AB (ref 65–99)
Potassium: 4 mmol/L (ref 3.5–5.1)
Sodium: 138 mmol/L (ref 135–145)

## 2016-06-30 LAB — SURGICAL PCR SCREEN
MRSA, PCR: NEGATIVE
Staphylococcus aureus: POSITIVE — AB

## 2016-06-30 LAB — PROTIME-INR
INR: 0.98
Prothrombin Time: 13 seconds (ref 11.4–15.2)

## 2016-06-30 LAB — TYPE AND SCREEN
ABO/RH(D): O POS
Antibody Screen: NEGATIVE

## 2016-06-30 LAB — CK: Total CK: 194 U/L (ref 38–234)

## 2016-06-30 LAB — ABO/RH: ABO/RH(D): O POS

## 2016-06-30 MED ORDER — MIRTAZAPINE 15 MG PO TABS
7.5000 mg | ORAL_TABLET | Freq: Every day | ORAL | Status: DC
  Filled 2016-06-30: qty 1

## 2016-06-30 MED ORDER — MORPHINE SULFATE (PF) 2 MG/ML IV SOLN: 0.5000 mg | INTRAVENOUS | Status: DC | PRN

## 2016-06-30 MED ORDER — CEFAZOLIN SODIUM-DEXTROSE 2-4 GM/100ML-% IV SOLN
2.0000 g | INTRAVENOUS | Status: DC
  Filled 2016-06-30: qty 100

## 2016-06-30 MED ORDER — POLYETHYLENE GLYCOL 3350 17 G PO PACK: 17.0000 g | PACK | Freq: Every day | ORAL | Status: DC | PRN

## 2016-06-30 MED ORDER — POVIDONE-IODINE 10 % EX SWAB: 2.0000 "application " | Freq: Once | CUTANEOUS | Status: DC

## 2016-06-30 MED ORDER — CHLORHEXIDINE GLUCONATE 4 % EX LIQD: 60.0000 mL | Freq: Once | CUTANEOUS | Status: AC

## 2016-06-30 MED ORDER — HYDROCODONE-ACETAMINOPHEN 5-325 MG PO TABS
1.0000 | ORAL_TABLET | Freq: Four times a day (QID) | ORAL | Status: DC | PRN
  Administered 2016-06-30: 2 via ORAL
  Filled 2016-06-30: qty 2

## 2016-06-30 MED ORDER — QUETIAPINE 12.5 MG HALF TABLET
12.5000 mg | ORAL_TABLET | Freq: Two times a day (BID) | ORAL | Status: DC
  Administered 2016-06-30: 12.5 mg via ORAL
  Filled 2016-06-30 (×3): qty 0.5

## 2016-06-30 MED ORDER — SODIUM CHLORIDE 0.9 % IV SOLN: INTRAVENOUS | Status: DC

## 2016-06-30 MED ORDER — SODIUM CHLORIDE 0.9 % IV SOLN
INTRAVENOUS | Status: DC
  Administered 2016-06-30 – 2016-07-01 (×2): via INTRAVENOUS

## 2016-06-30 NOTE — ED Notes (Signed)
[facility] at bedside to transport patient to [facility] 

## 2016-06-30 NOTE — H&P (Signed)
History and Physical  [name]:[id number] DOB: 1950-05-08 DOA: 06/30/2016  Referring physician: Arthor Captain, ER PA  PCP: Thane Edu, MD  Outpatient Specialists: None Patient coming from: Skilled nursing memory care unit & is able to ambulate without assistance although son reports an occasional fall  Chief Complaint: Fall   HPI: [name] is a 66 y.o. female with medical history significant of Alzheimer's dementia and hypertension who resides in a memory care unit and was found today on the ground on her left side. Patient had an unwitnessed fall. She was unable to stand. She brought into the emergency room.  ED Course: The emergency room, lab work was done which was unremarkable. X-rays revealed a left femoral neck fracture. Hospitalists and orthopedist for called for further evaluation.  Review of Systems: Patient seen in the emergency room . Pt  sedated and is somnolent. I'm unable to get any kind of review of systems from her.   Past Medical History:  Diagnosis Date  . Alzheimer's disease 01/01/2014  . Anxiety disorder   . COPD (chronic obstructive pulmonary disease) (HCC)   . Hypertension   . Memory disturbance    Past Surgical History:  Procedure Laterality Date  . None      Social History:  reports that she has quit smoking. She has never used smokeless tobacco. She reports that she does not drink alcohol or use drugs.   No Known Allergies  Family History  Problem Relation Age of Onset  . Alzheimer's disease Brother       Prior to Admission medications   Medication Sig Start Date End Date Taking? Authorizing Provider  alum & mag hydroxide-simeth (GNP ANTACID ANTI-GAS) 200-200-20 MG/5ML suspension Take 30 mLs by mouth daily as needed for indigestion.   Yes Historical Provider, MD  azelastine (ASTELIN) 0.1 % nasal spray Place 2 sprays into both nostrils daily after breakfast. Use in each nostril as directed   Yes Historical Provider, MD    cholecalciferol (VITAMIN D) 1000 units tablet Take 1,000 Units by mouth daily with breakfast.   Yes Historical Provider, MD  mirtazapine (REMERON) 7.5 MG tablet Take 7.5 mg by mouth daily with breakfast.   Yes Historical Provider, MD  QUEtiapine (SEROQUEL) 25 MG tablet Take 12.5 mg by mouth 2 (two) times daily.   Yes Historical Provider, MD    Physical Exam: BP 164/93 (BP Location: Right Arm)   Pulse 85   Temp 98.3 F (36.8 C) (Oral)   Resp 16   Ht 5\' 5"  (1.651 m)   Wt 65.3 kg (144 lb)   SpO2 100%   BMI 23.96 kg/m   General:  somnolent, does not wake up to voice or physical exam   Eyes:   sclera nonicteric  ENT:  normocephalic, atraumatic, mucous membranes are slightly dry  Neck: Supple, no JVD  Cardiovascular: Regular rate and rhythm, S1-S2  Respiratory:  clear to auscultation bilaterally  Abdomen: Soft, nondistended, hypoactive bowel sounds Skin: No skin breaks, tears or lesions Musculoskeletal: No clubbing or cyanosis, trace pitting edema bilaterally from the knees down   Psychiatric:  chronic dementia, currently sedated.  Neurologic: no focal deficits although exam is limited due to patient's sedation           Labs on Admission:  Basic Metabolic Panel:  Recent Labs Lab 06/30/16 1243  NA 138  K 4.0  CL 103  CO2 28  GLUCOSE 165*  BUN 15  CREATININE 0.68  CALCIUM 9.4   Liver  Function Tests: No results for input(s): AST, ALT, ALKPHOS, BILITOT, PROT, ALBUMIN in the last 168 hours. No results for input(s): LIPASE, AMYLASE in the last 168 hours. No results for input(s): AMMONIA in the last 168 hours. CBC:  Recent Labs Lab 06/30/16 1243  WBC 12.4*  NEUTROABS 11.4*  HGB 12.3  HCT 37.9  MCV 82.9  PLT 231   Cardiac Enzymes:  Recent Labs Lab 06/30/16 1243  CKTOTAL 194    BNP (last 3 results) No results for input(s): BNP in the last 8760 hours.  ProBNP (last 3 results) No results for input(s): PROBNP in the last 8760 hours.  CBG: No results for  input(s): GLUCAP in the last 168 hours.  Radiological Exams on Admission: Dg Hip Unilat  With Pelvis 2-3 Views Right  Result Date: 06/30/2016 CLINICAL DATA:  Deformity of the right lower extremity after a fall from bed this morning. EXAM: DG HIP (WITH OR WITHOUT PELVIS) 2-3V RIGHT COMPARISON:  None. FINDINGS: There is an angulated overriding subcapital fracture of the proximal right femoral neck. Pelvic bones are intact. IMPRESSION: Angulated overriding subcapital fracture of the right femoral neck. Electronically Signed   By: Francene BoyersJames  Maxwell M.D.   On: 06/30/2016 12:19    EKG: Ordered, results pending    Assessment/Plan Present on Admission: . Alzheimer's disease: Monitor for sundowning. If need be, tonight we'll have when necessary Ativan on board to prevent severe agitation and her trying to get out of bed with broken hip. Return back to memory care unit after surgery. We will see how she does in terms of rehabilitation.  . Hypertension: Stable. Continue home medications.  Marland Kitchen. COPD (chronic obstructive pulmonary disease) (HCC): : Stable. Patient no longer a smoker.  . Closed displaced fracture of right femoral neck [facility](HCC): Seen by orthopedic surgery. Plans for surgery tomorrow. Checking screening chest x-ray, EKG and UA. Results pending. Marland Kitchen. Displaced fracture of right femoral neck (HCC)  Principal Problem:   Closed displaced fracture of right femoral neck (HCC) Active Problems:   Alzheimer's disease   Hypertension   COPD (chronic obstructive pulmonary disease) (HCC)   Displaced fracture of right femoral neck (HCC)   DVT prophylaxis: SCDs for now   Code Status: Full code as confirmed by son   Family Communication: Son at the bedside   Disposition Plan: Surgery tomorrow, anticipate will be here for several days after. Skilled nursing either at the end of this week or beginning of next   Consults called: Ave Filterhandler, Ortho   Admission status: Given the patient will be here for  several days will call in Hospital services, have admitted under inpatient     Hollice EspyKRISHNAN,SENDIL K MD [facility] Pager [phone]  If 7PM-7AM, please contact night-coverage [email or web address] Password [email or web address]  06/30/2016, 3:46 PM

## 2016-06-30 NOTE — Consult Note (Signed)
Reason for Consult:right hip fracture Referring Physician: [name], [name] is an 66 y.o. female.  HPI: 66 yo female with hx dementia residing at memory care facility had an unwitnessed fall the am. Son provides the history. Transported to ED via EMS for right leg pain and a shortened, externally rotated leg. Per son, at baseline she ambulates with assistance/walker. She falls frequently. She is not on a blood thinner. No previous orthopedic hx.   Past Medical History:  Diagnosis Date  . Alzheimer's disease 01/01/2014  . Anxiety disorder   . COPD (chronic obstructive pulmonary disease) ([place])   . Hypertension   . Memory disturbance     Past Surgical History:  Procedure Laterality Date  . None      Family History  Problem Relation Age of Onset  . Alzheimer's disease Brother     Social History:  reports that she has quit smoking. She has never used smokeless tobacco. She reports that she does not drink alcohol or use drugs.  Allergies: No Known Allergies  Medications: I have reviewed the patient's current medications.  Results for orders placed or performed during the hospital encounter of 06/30/16 (from the past 48 hour(s))  Basic metabolic panel     Status: Abnormal   Collection Time: 06/30/16 12:43 PM  Result Value Ref Range   Sodium 138 135 - 145 mmol/L   Potassium 4.0 3.5 - 5.1 mmol/L   Chloride 103 101 - 111 mmol/L   CO2 28 22 - 32 mmol/L   Glucose, Bld 165 (H) 65 - 99 mg/dL   BUN 15 6 - 20 mg/dL   Creatinine, Ser 0.68 0.44 - 1.00 mg/dL   Calcium 9.4 8.9 - 10.3 mg/dL   GFR calc non Af Amer >60 >60 mL/min   GFR calc Af Amer >60 >60 mL/min    Comment: (NOTE) The eGFR has been calculated using the CKD EPI equation. This calculation has not been validated in all clinical situations. eGFR's persistently <60 mL/min signify possible Chronic Kidney Disease.    Anion gap 7 5 - 15  CBC WITH DIFFERENTIAL     Status: Abnormal   Collection Time: 06/30/16 12:43 PM   Result Value Ref Range   WBC 12.4 (H) 4.0 - 10.5 K/uL   RBC 4.57 3.87 - 5.11 MIL/uL   Hemoglobin 12.3 12.0 - 15.0 g/dL   HCT 37.9 36.0 - 46.0 %   MCV 82.9 78.0 - 100.0 fL   MCH 26.9 26.0 - 34.0 pg   MCHC 32.5 30.0 - 36.0 g/dL   RDW 15.0 11.5 - 15.5 %   Platelets 231 150 - 400 K/uL   Neutrophils Relative % 92 %   Neutro Abs 11.4 (H) 1.7 - 7.7 K/uL   Lymphocytes Relative 4 %   Lymphs Abs 0.5 (L) 0.7 - 4.0 K/uL   Monocytes Relative 4 %   Monocytes Absolute 0.5 0.1 - 1.0 K/uL   Eosinophils Relative 0 %   Eosinophils Absolute 0.0 0.0 - 0.7 K/uL   Basophils Relative 0 %   Basophils Absolute 0.0 0.0 - 0.1 K/uL  Protime-INR     Status: None   Collection Time: 06/30/16 12:43 PM  Result Value Ref Range   Prothrombin Time 13.0 11.4 - 15.2 seconds   INR 0.98   Type and screen [place]     Status: None   Collection Time: 06/30/16 12:43 PM  Result Value Ref Range   ABO/RH(D) O POS  Antibody Screen NEG    Sample Expiration 07/03/2016   CK     Status: None   Collection Time: 06/30/16 12:43 PM  Result Value Ref Range   Total CK 194 38 - 234 U/L  ABO/Rh     Status: None   Collection Time: 06/30/16 12:43 PM  Result Value Ref Range   ABO/RH(D) O POS     Dg Hip Unilat  With Pelvis 2-3 Views Right  Result Date: 06/30/2016 CLINICAL DATA:  Deformity of the right lower extremity after a fall from bed this morning. EXAM: DG HIP (WITH OR WITHOUT PELVIS) 2-3V RIGHT COMPARISON:  None. FINDINGS: There is an angulated overriding subcapital fracture of the proximal right femoral neck. Pelvic bones are intact. IMPRESSION: Angulated overriding subcapital fracture of the right femoral neck. Electronically Signed   By: Lorriane Shire M.D.   On: 06/30/2016 12:19    Review of Systems  Unable to perform ROS: Dementia   Blood pressure 166/76, pulse 89, temperature 98.3 F (36.8 C), temperature source Oral, resp. rate 14, height _0  (1.651 m), weight 65.3 kg (144 lb), SpO2 100  %. Physical Exam  Constitutional: She appears well-developed and well-nourished.  HENT:  Head: Normocephalic and atraumatic.  Eyes: EOM are normal.  Cardiovascular: Normal rate.   Respiratory: Effort normal.  Musculoskeletal:  R hip TTP  ROM R hip limited by pain R LE shortened and ext rotated Distally grossly NVI  Neurological: She is alert.  Skin: Skin is warm and dry.  Psychiatric: She has a normal mood and affect.    Assessment/Plan: Right subcapital fracture of the right femoral neck after fall, in patient with severe dementia Plan for right hip hemiarthroplasty tomorrow afternoon by Dr. Mayer Camel Transport to [facility] today for surgery tomorrow NWB until surgery NPO  Grier Mitts 06/30/2016, 2:31 PM

## 2016-06-30 NOTE — ED Triage Notes (Addendum)
Per EMS, patient had unwitnessed fall from bed. Physician on site states leg is right leg rotated out.  Patient presents with no pain. [facility] Hx of dementia. Patient is alert and oriented to baseline. Patient is minimally verbal at baseline. Son at bedside.

## 2016-06-30 NOTE — ED Provider Notes (Signed)
[facility]-EMERGENCY DEPT Provider Note   CSN: [id number] Arrival date & time: 06/30/16  1117     History   Chief Complaint Chief Complaint  Patient presents with  . Fall    HPI Level V caveat due to dementia [name] is a 66 y.o. female who presents via EMS from her memory care facility for unwitnessed fall. Her son is here with her and states that she was down for an unknown amount of time. According to EMS report and paperwork patient was noted to have right leg pain and a shortened, externally rotated leg. No other known injuries. She has a history of dementia, COPD, reflux. She is not on any blood thinners.  HPI  Past Medical History:  Diagnosis Date  . Alzheimer's disease 01/01/2014  . Anxiety disorder   . COPD (chronic obstructive pulmonary disease) (HCC)   . Hypertension   . Memory disturbance     Patient Active Problem List   Diagnosis Date Noted  . Alzheimer's disease 01/01/2014  . 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 None      OB History    No data available       Home Medications    Prior to Admission medications   Medication Sig Start Date End Date Taking? Authorizing Provider  alum & mag hydroxide-simeth (GNP ANTACID ANTI-GAS) 200-200-20 MG/5ML suspension Take 30 mLs by mouth daily as needed for indigestion.   Yes Historical Provider, MD  azelastine (ASTELIN) 0.1 % nasal spray Place 2 sprays into both nostrils daily after breakfast. Use in each nostril as directed   Yes Historical Provider, MD  cholecalciferol (VITAMIN D) 1000 units tablet Take 1,000 Units by mouth daily with breakfast.   Yes Historical Provider, MD  mirtazapine (REMERON) 7.5 MG tablet Take 7.5 mg by mouth daily with breakfast.   Yes Historical Provider, MD  QUEtiapine (SEROQUEL) 25 MG tablet Take 12.5 mg by mouth 2 (two) times daily.   Yes Historical Provider, MD    Family  History Family History  Problem Relation Age of Onset  . Alzheimer's disease Brother     Social History Social History  Substance Use Topics  . Smoking status: Former Games developermoker  . Smokeless tobacco: Never Used  . Alcohol use No     Allergies   Patient has no known allergies.   Review of Systems Review of Systems Ten systems reviewed and are negative for acute change, except as noted in the HPI.    Physical Exam Updated Vital Signs BP 154/83 (BP Location: Left Arm)   Pulse 62   Temp 98.3 F (36.8 C) (Oral)   Resp 16   Ht 5\' 5"  (1.651 m)   Wt 65.3 kg   SpO2 100%   BMI 23.96 kg/m   Physical Exam  Constitutional: She appears well-developed and well-nourished. No distress.  HENT:  Head: Normocephalic and atraumatic.  Eyes: Conjunctivae are normal. No scleral icterus.  Neck: Normal range of motion.  Cardiovascular: Normal rate, regular rhythm and normal heart sounds.  Exam reveals no gallop and no friction rub.   No murmur heard. Pulmonary/Chest: Effort normal and breath sounds normal. No respiratory distress.  Abdominal: Soft. Bowel sounds are normal. She exhibits no distension and no mass. There is no tenderness. There is no guarding.  Musculoskeletal:  Shortened externally rotated right leg. Patient seems to with draw with movement of the leg. Neurovascularly intact. Thorax had  an axial skeleton were visualized and palpated and no other signs of injury.  Neurological: She is alert.  Skin: Skin is warm and dry. She is not diaphoretic.  Nursing note and vitals reviewed.    ED Treatments / Results  Labs (all labs ordered are listed, but only abnormal results are displayed) Labs Reviewed  BASIC METABOLIC PANEL  CBC WITH DIFFERENTIAL/PLATELET  PROTIME-INR  CK  TYPE AND SCREEN    EKG  EKG Interpretation None       Radiology Dg Hip Unilat  With Pelvis 2-3 Views Right  Result Date: 06/30/2016 CLINICAL DATA:  Deformity of the right lower extremity after a  fall from bed this morning. EXAM: DG HIP (WITH OR WITHOUT PELVIS) 2-3V RIGHT COMPARISON:  None. FINDINGS: There is an angulated overriding subcapital fracture of the proximal right femoral neck. Pelvic bones are intact. IMPRESSION: Angulated overriding subcapital fracture of the right femoral neck. Electronically Signed   By: Francene Boyers M.D.   On: 06/30/2016 12:19    Procedures Procedures (including critical care time)  Medications Ordered in ED Medications - No data to display   Initial Impression / Assessment and Plan / ED Course  I have reviewed the triage vital signs and the nursing notes.  Pertinent labs & imaging results that were available during my care of the patient were reviewed by me and considered in my medical decision making (see chart for details).    Patient with R hip fracture.Patient will be admitted to the hospitalist. I have spoken with Dr. Ave Filter. Patient will be admitted at [facility]. Patient without elevated CK suggestive of rhabdo.   Final Clinical Impressions(s) / ED Diagnoses   Final diagnoses:  Hip fracture (HCC)  Unwitnessed fall  Surgery, elective  Closed displaced fracture of right femoral neck [facility])    New Prescriptions New Prescriptions   No medications on file     Arthor Captain, PA-C 07/01/16 2258    Lorre Nick, MD 07/03/16 [phone]

## 2016-07-01 ENCOUNTER — Encounter (HOSPITAL_COMMUNITY)
Admission: EM | Disposition: A | Discharge status: Skilled Nursing Facility | Payer: Self-pay | Source: Home / Self Care | Attending: Internal Medicine

## 2016-07-01 ENCOUNTER — Encounter (HOSPITAL_COMMUNITY): Payer: Self-pay | Admitting: *Deleted

## 2016-07-01 ENCOUNTER — Inpatient Hospital Stay (HOSPITAL_COMMUNITY): Payer: Medicare (Managed Care) | Admitting: Anesthesiology

## 2016-07-01 ENCOUNTER — Inpatient Hospital Stay (HOSPITAL_COMMUNITY): Payer: Medicare (Managed Care)

## 2016-07-01 DIAGNOSIS — S72001A Fracture of unspecified part of neck of right femur, initial encounter for closed fracture: Secondary | ICD-10-CM

## 2016-07-01 HISTORY — PX: ANTERIOR APPROACH HEMI HIP ARTHROPLASTY: SHX6690

## 2016-07-01 LAB — ABO/RH: ABO/RH(D): O POS

## 2016-07-01 LAB — BASIC METABOLIC PANEL
Anion gap: 5 (ref 5–15)
BUN: 15 mg/dL (ref 6–20)
CALCIUM: 8.9 mg/dL (ref 8.9–10.3)
CO2: 26 mmol/L (ref 22–32)
CREATININE: 0.89 mg/dL (ref 0.44–1.00)
Chloride: 108 mmol/L (ref 101–111)
GFR calc Af Amer: >60 mL/min (ref >60)
GLUCOSE: 120 mg/dL — AB (ref 65–99)
POTASSIUM: 4 mmol/L (ref 3.5–5.1)
Sodium: 139 mmol/L (ref 135–145)

## 2016-07-01 LAB — CBC
HCT: 34.5 % — ABNORMAL LOW (ref 36.0–46.0)
Hemoglobin: 10.9 g/dL — ABNORMAL LOW (ref 12.0–15.0)
MCH: 26.8 pg (ref 26.0–34.0)
MCHC: 31.6 g/dL (ref 30.0–36.0)
MCV: 84.8 fL (ref 78.0–100.0)
Platelets: 207 10*3/uL (ref 150–400)
RBC: 4.07 MIL/uL (ref 3.87–5.11)
RDW: 15.4 % (ref 11.5–15.5)
WBC: 8.6 10*3/uL (ref 4.0–10.5)

## 2016-07-01 LAB — TYPE AND SCREEN
ABO/RH(D): O POS
Antibody Screen: NEGATIVE

## 2016-07-01 SURGERY — HEMIARTHROPLASTY, HIP, DIRECT ANTERIOR APPROACH, FOR FRACTURE
Anesthesia: General | Site: Hip | Laterality: Right

## 2016-07-01 MED ORDER — LIDOCAINE-EPINEPHRINE 2 %-1:100000 IJ SOLN
INTRAMUSCULAR | Status: AC
Start: 2016-07-01 — End: 2016-07-01
  Filled 2016-07-01: qty 1

## 2016-07-01 MED ORDER — PROPOFOL 10 MG/ML IV BOLUS
INTRAVENOUS | Status: AC
  Filled 2016-07-01: qty 20

## 2016-07-01 MED ORDER — MIDAZOLAM HCL 2 MG/2ML IJ SOLN: 0.5000 mg | Freq: Once | INTRAMUSCULAR | Status: DC | PRN

## 2016-07-01 MED ORDER — DOCUSATE SODIUM 100 MG PO CAPS
100.0000 mg | ORAL_CAPSULE | Freq: Two times a day (BID) | ORAL | Status: DC
  Administered 2016-07-01 – 2016-07-02 (×2): 100 mg via ORAL
  Filled 2016-07-01 (×2): qty 1

## 2016-07-01 MED ORDER — METOCLOPRAMIDE HCL 5 MG/ML IJ SOLN: 5.0000 mg | Freq: Three times a day (TID) | INTRAMUSCULAR | Status: DC | PRN

## 2016-07-01 MED ORDER — ACETAMINOPHEN 650 MG RE SUPP: 650.0000 mg | Freq: Four times a day (QID) | RECTAL | Status: DC | PRN

## 2016-07-01 MED ORDER — LIDOCAINE-EPINEPHRINE (PF) 1 %-1:200000 IJ SOLN
INTRAMUSCULAR | Status: AC
  Filled 2016-07-01: qty 30

## 2016-07-01 MED ORDER — PROMETHAZINE HCL 25 MG/ML IJ SOLN: 6.2500 mg | INTRAMUSCULAR | Status: DC | PRN

## 2016-07-01 MED ORDER — CHLORHEXIDINE GLUCONATE CLOTH 2 % EX PADS
6.0000 | MEDICATED_PAD | Freq: Every day | CUTANEOUS | Status: DC
  Administered 2016-07-01: 6 via TOPICAL

## 2016-07-01 MED ORDER — ENSURE ENLIVE PO LIQD
237.0000 mL | Freq: Two times a day (BID) | ORAL | Status: DC
  Filled 2016-07-01: qty 237

## 2016-07-01 MED ORDER — PHENOL 1.4 % MT LIQD: 1.0000 | OROMUCOSAL | Status: DC | PRN

## 2016-07-01 MED ORDER — LIDOCAINE HCL (CARDIAC) 20 MG/ML IV SOLN
INTRAVENOUS | Status: DC | PRN
  Administered 2016-07-01: 40 mg via INTRAVENOUS

## 2016-07-01 MED ORDER — PHENYLEPHRINE HCL 10 MG/ML IJ SOLN
INTRAVENOUS | Status: DC | PRN
  Administered 2016-07-01: 20 ug/min via INTRAVENOUS

## 2016-07-01 MED ORDER — CEFAZOLIN SODIUM 1 G IJ SOLR
INTRAMUSCULAR | Status: DC | PRN
  Administered 2016-07-01: 2 g via INTRAMUSCULAR

## 2016-07-01 MED ORDER — BUPIVACAINE LIPOSOME 1.3 % IJ SUSP
20.0000 mL | INTRAMUSCULAR | Status: DC
  Filled 2016-07-01: qty 20

## 2016-07-01 MED ORDER — BUPIVACAINE-EPINEPHRINE 0.5% -1:200000 IJ SOLN
INTRAMUSCULAR | Status: DC | PRN
  Administered 2016-07-01: 30 mL

## 2016-07-01 MED ORDER — KCL IN DEXTROSE-NACL 20-5-0.45 MEQ/L-%-% IV SOLN
INTRAVENOUS | Status: DC
  Administered 2016-07-02: 06:00:00 via INTRAVENOUS
  Filled 2016-07-01: qty 1000

## 2016-07-01 MED ORDER — OXYCODONE HCL 5 MG PO TABS: 5.0000 mg | ORAL_TABLET | ORAL | Status: DC | PRN

## 2016-07-01 MED ORDER — 0.9 % SODIUM CHLORIDE (POUR BTL) OPTIME
TOPICAL | Status: DC | PRN
  Administered 2016-07-01: 1000 mL

## 2016-07-01 MED ORDER — ALUM & MAG HYDROXIDE-SIMETH 200-200-20 MG/5ML PO SUSP: 30.0000 mL | Freq: Every day | ORAL | Status: DC | PRN

## 2016-07-01 MED ORDER — LIDOCAINE 2% (20 MG/ML) 5 ML SYRINGE
INTRAMUSCULAR | Status: AC
  Filled 2016-07-01: qty 5

## 2016-07-01 MED ORDER — DEXAMETHASONE SODIUM PHOSPHATE 10 MG/ML IJ SOLN
10.0000 mg | Freq: Once | INTRAMUSCULAR | Status: AC
Start: 2016-07-02 — End: 2016-07-02
  Administered 2016-07-02: 10 mg via INTRAVENOUS
  Filled 2016-07-01: qty 1

## 2016-07-01 MED ORDER — DEXAMETHASONE SODIUM PHOSPHATE 10 MG/ML IJ SOLN
INTRAMUSCULAR | Status: AC
  Filled 2016-07-01: qty 1

## 2016-07-01 MED ORDER — VITAMIN D 1000 UNITS PO TABS
1000.0000 [IU] | ORAL_TABLET | Freq: Every day | ORAL | Status: DC
Start: 2016-07-02 — End: 2016-07-02
  Administered 2016-07-02: 1000 [IU] via ORAL
  Filled 2016-07-01: qty 1

## 2016-07-01 MED ORDER — SENNOSIDES-DOCUSATE SODIUM 8.6-50 MG PO TABS: 1.0000 | ORAL_TABLET | Freq: Every evening | ORAL | Status: DC | PRN

## 2016-07-01 MED ORDER — SODIUM CHLORIDE 0.9 % IV SOLN
1000.0000 mg | INTRAVENOUS | Status: AC
  Administered 2016-07-01: 1000 mg via INTRAVENOUS
  Filled 2016-07-01: qty 10

## 2016-07-01 MED ORDER — AZELASTINE HCL 0.1 % NA SOLN
2.0000 | Freq: Every day | NASAL | Status: DC
  Administered 2016-07-02: 2 via NASAL
  Filled 2016-07-01: qty 30

## 2016-07-01 MED ORDER — ONDANSETRON HCL 4 MG PO TABS: 4.0000 mg | ORAL_TABLET | Freq: Four times a day (QID) | ORAL | Status: DC | PRN

## 2016-07-01 MED ORDER — ONDANSETRON HCL 4 MG/2ML IJ SOLN: 4.0000 mg | Freq: Four times a day (QID) | INTRAMUSCULAR | Status: DC | PRN

## 2016-07-01 MED ORDER — SUGAMMADEX SODIUM 200 MG/2ML IV SOLN
INTRAVENOUS | Status: AC
  Filled 2016-07-01: qty 2

## 2016-07-01 MED ORDER — ASPIRIN EC 325 MG PO TBEC: 325.0000 mg | DELAYED_RELEASE_TABLET | Freq: Two times a day (BID) | ORAL | 0 refills | Status: AC

## 2016-07-01 MED ORDER — ROCURONIUM BROMIDE 100 MG/10ML IV SOLN
INTRAVENOUS | Status: DC | PRN
  Administered 2016-07-01: 40 mg via INTRAVENOUS

## 2016-07-01 MED ORDER — ACETAMINOPHEN 325 MG PO TABS
650.0000 mg | ORAL_TABLET | Freq: Four times a day (QID) | ORAL | Status: DC | PRN
  Administered 2016-07-02 (×2): 650 mg via ORAL
  Filled 2016-07-01 (×2): qty 2

## 2016-07-01 MED ORDER — MEPERIDINE HCL 25 MG/ML IJ SOLN: 6.2500 mg | INTRAMUSCULAR | Status: DC | PRN

## 2016-07-01 MED ORDER — TRANEXAMIC ACID 1000 MG/10ML IV SOLN
2000.0000 mg | Freq: Once | INTRAVENOUS | Status: AC
  Administered 2016-07-01: 2000 mg via TOPICAL
  Filled 2016-07-01: qty 20

## 2016-07-01 MED ORDER — MIDAZOLAM HCL 2 MG/2ML IJ SOLN
INTRAMUSCULAR | Status: AC
  Filled 2016-07-01: qty 2

## 2016-07-01 MED ORDER — FENTANYL CITRATE (PF) 100 MCG/2ML IJ SOLN
25.0000 ug | INTRAMUSCULAR | Status: DC | PRN
  Administered 2016-07-01: 50 ug via INTRAVENOUS

## 2016-07-01 MED ORDER — METHOCARBAMOL 1000 MG/10ML IJ SOLN: 500.0000 mg | Freq: Four times a day (QID) | INTRAMUSCULAR | Status: DC | PRN

## 2016-07-01 MED ORDER — PROPOFOL 10 MG/ML IV BOLUS
INTRAVENOUS | Status: DC | PRN
  Administered 2016-07-01: 80 mg via INTRAVENOUS

## 2016-07-01 MED ORDER — BUPIVACAINE-EPINEPHRINE (PF) 0.5% -1:200000 IJ SOLN
INTRAMUSCULAR | Status: AC
  Filled 2016-07-01: qty 30

## 2016-07-01 MED ORDER — LACTATED RINGERS IV SOLN
INTRAVENOUS | Status: DC
  Administered 2016-07-01 (×2): via INTRAVENOUS

## 2016-07-01 MED ORDER — METOCLOPRAMIDE HCL 5 MG PO TABS: 5.0000 mg | ORAL_TABLET | Freq: Three times a day (TID) | ORAL | Status: DC | PRN

## 2016-07-01 MED ORDER — FLEET ENEMA 7-19 GM/118ML RE ENEM: 1.0000 | ENEMA | Freq: Once | RECTAL | Status: DC | PRN

## 2016-07-01 MED ORDER — HYDROCODONE-ACETAMINOPHEN 5-325 MG PO TABS
1.0000 | ORAL_TABLET | ORAL | 0 refills | Status: AC | PRN
Start: 2016-07-01 — End: ?

## 2016-07-01 MED ORDER — MORPHINE SULFATE (PF) 2 MG/ML IV SOLN
1.0000 mg | INTRAVENOUS | Status: DC | PRN
Start: 2016-07-01 — End: 2016-07-02

## 2016-07-01 MED ORDER — MUPIROCIN 2 % EX OINT
1.0000 "application " | TOPICAL_OINTMENT | Freq: Two times a day (BID) | CUTANEOUS | Status: DC
  Administered 2016-07-01: 1 via NASAL
  Filled 2016-07-01: qty 22

## 2016-07-01 MED ORDER — LIDOCAINE HCL (PF) 1 % IJ SOLN
INTRAMUSCULAR | Status: AC
  Filled 2016-07-01: qty 30

## 2016-07-01 MED ORDER — BUPIVACAINE LIPOSOME 1.3 % IJ SUSP
INTRAMUSCULAR | Status: DC | PRN
  Administered 2016-07-01: 20 mL

## 2016-07-01 MED ORDER — MENTHOL 3 MG MT LOZG: 1.0000 | LOZENGE | OROMUCOSAL | Status: DC | PRN

## 2016-07-01 MED ORDER — BISACODYL 5 MG PO TBEC: 5.0000 mg | DELAYED_RELEASE_TABLET | Freq: Every day | ORAL | Status: DC | PRN

## 2016-07-01 MED ORDER — FENTANYL CITRATE (PF) 100 MCG/2ML IJ SOLN
INTRAMUSCULAR | Status: AC
  Filled 2016-07-01: qty 2

## 2016-07-01 MED ORDER — SUGAMMADEX SODIUM 200 MG/2ML IV SOLN
INTRAVENOUS | Status: DC | PRN
  Administered 2016-07-01: 150 mg via INTRAVENOUS

## 2016-07-01 MED ORDER — DIPHENHYDRAMINE HCL 12.5 MG/5ML PO ELIX: 12.5000 mg | ORAL_SOLUTION | ORAL | Status: DC | PRN

## 2016-07-01 MED ORDER — ONDANSETRON HCL 4 MG/2ML IJ SOLN
INTRAMUSCULAR | Status: DC | PRN
  Administered 2016-07-01: 4 mg via INTRAVENOUS

## 2016-07-01 MED ORDER — ARTIFICIAL TEARS OP OINT
TOPICAL_OINTMENT | OPHTHALMIC | Status: AC
  Filled 2016-07-01: qty 7

## 2016-07-01 MED ORDER — ASPIRIN EC 325 MG PO TBEC
325.0000 mg | DELAYED_RELEASE_TABLET | Freq: Every day | ORAL | Status: DC
  Administered 2016-07-02: 325 mg via ORAL
  Filled 2016-07-01: qty 1

## 2016-07-01 MED ORDER — BUPIVACAINE HCL (PF) 0.25 % IJ SOLN
INTRAMUSCULAR | Status: AC
  Filled 2016-07-01: qty 30

## 2016-07-01 MED ORDER — METHOCARBAMOL 500 MG PO TABS: 500.0000 mg | ORAL_TABLET | Freq: Four times a day (QID) | ORAL | Status: DC | PRN

## 2016-07-01 MED ORDER — TIZANIDINE HCL 2 MG PO TABS: 2.0000 mg | ORAL_TABLET | Freq: Four times a day (QID) | ORAL | 0 refills | Status: AC | PRN

## 2016-07-01 MED ORDER — TRANEXAMIC ACID 1000 MG/10ML IV SOLN: 1000.0000 mg | INTRAVENOUS | Status: DC

## 2016-07-01 MED ORDER — FENTANYL CITRATE (PF) 100 MCG/2ML IJ SOLN
INTRAMUSCULAR | Status: DC | PRN
  Administered 2016-07-01 (×2): 50 ug via INTRAVENOUS

## 2016-07-01 MED ORDER — FENTANYL CITRATE (PF) 100 MCG/2ML IJ SOLN
INTRAMUSCULAR | Status: AC
Start: 2016-07-01 — End: 2016-07-01
  Filled 2016-07-01: qty 2

## 2016-07-01 SURGICAL SUPPLY — 46 items
BLADE SAW SGTL 18X1.27X75 (BLADE) ×2 IMPLANT
BLADE SAW SGTL 18X1.27X75MM (BLADE) ×1
CAPT HIP HEMI 2 ×3 IMPLANT
COVER PERINEAL POST (MISCELLANEOUS) ×3 IMPLANT
COVER SURGICAL LIGHT HANDLE (MISCELLANEOUS) ×3 IMPLANT
DRAPE C-ARM 42X72 X-RAY (DRAPES) ×3 IMPLANT
DRAPE PROXIMA HALF (DRAPES) IMPLANT
DRAPE U-SHAPE 47X51 STRL (DRAPES) ×6 IMPLANT
DRSG AQUACEL AG ADV 3.5X10 (GAUZE/BANDAGES/DRESSINGS) ×3 IMPLANT
DURAPREP 26ML APPLICATOR (WOUND CARE) ×3 IMPLANT
ELECT BLADE 4.0 EZ CLEAN MEGAD (MISCELLANEOUS) ×3
ELECT REM PT RETURN 9FT ADLT (ELECTROSURGICAL) ×3
ELECTRODE BLDE 4.0 EZ CLN MEGD (MISCELLANEOUS) ×1 IMPLANT
ELECTRODE REM PT RTRN 9FT ADLT (ELECTROSURGICAL) ×1 IMPLANT
GLOVE BIO SURGEON STRL SZ7.5 (GLOVE) ×3 IMPLANT
GLOVE BIO SURGEON STRL SZ8.5 (GLOVE) ×3 IMPLANT
GLOVE BIOGEL PI IND STRL 8 (GLOVE) ×1 IMPLANT
GLOVE BIOGEL PI IND STRL 9 (GLOVE) ×1 IMPLANT
GLOVE BIOGEL PI INDICATOR 8 (GLOVE) ×2
GLOVE BIOGEL PI INDICATOR 9 (GLOVE) ×2
GOWN STRL REUS W/ TWL LRG LVL3 (GOWN DISPOSABLE) ×2 IMPLANT
GOWN STRL REUS W/ TWL XL LVL3 (GOWN DISPOSABLE) ×2 IMPLANT
GOWN STRL REUS W/TWL LRG LVL3 (GOWN DISPOSABLE) ×4
GOWN STRL REUS W/TWL XL LVL3 (GOWN DISPOSABLE) ×4
HANDPIECE INTERPULSE COAX TIP (DISPOSABLE)
HOOD PEEL AWAY FACE SHEILD DIS (HOOD) IMPLANT
KIT BASIN OR (CUSTOM PROCEDURE TRAY) ×3 IMPLANT
KIT ROOM TURNOVER OR (KITS) ×3 IMPLANT
MANIFOLD NEPTUNE II (INSTRUMENTS) ×3 IMPLANT
NEEDLE 22X1 1/2 (OR ONLY) (NEEDLE) ×6 IMPLANT
NS IRRIG 1000ML POUR BTL (IV SOLUTION) ×3 IMPLANT
PACK TOTAL JOINT (CUSTOM PROCEDURE TRAY) ×3 IMPLANT
PAD ARMBOARD 7.5X6 YLW CONV (MISCELLANEOUS) ×6 IMPLANT
PASSER SUT SWANSON 36MM LOOP (INSTRUMENTS) IMPLANT
SET HNDPC FAN SPRY TIP SCT (DISPOSABLE) IMPLANT
SUT ETHIBOND 2 V 37 (SUTURE) IMPLANT
SUT VIC AB 0 CTX 36 (SUTURE) ×2
SUT VIC AB 0 CTX36XBRD ANTBCTR (SUTURE) ×1 IMPLANT
SUT VIC AB 1 CTX 36 (SUTURE) ×2
SUT VIC AB 1 CTX36XBRD ANBCTR (SUTURE) ×1 IMPLANT
SUT VIC AB 2-0 CTB1 (SUTURE) ×3 IMPLANT
SUT VIC AB 3-0 CT1 27 (SUTURE) ×2
SUT VIC AB 3-0 CT1 TAPERPNT 27 (SUTURE) ×1 IMPLANT
SYR CONTROL 10ML LL (SYRINGE) ×6 IMPLANT
TOWEL OR 17X24 6PK STRL BLUE (TOWEL DISPOSABLE) ×3 IMPLANT
TOWEL OR 17X26 10 PK STRL BLUE (TOWEL DISPOSABLE) ×3 IMPLANT

## 2016-07-01 NOTE — Anesthesia Preprocedure Evaluation (Addendum)
Anesthesia Evaluation  Patient identified by MRN, date of birth, ID band Patient awake    Reviewed: Allergy & Precautions, NPO status , Patient's Chart, lab work & pertinent test results  History of Anesthesia Complications Negative for: history of anesthetic complications  Airway Mallampati: II      Comment: Pt uncooperative Dental  (+) Missing, Dental Advisory Given   Pulmonary former smoker,    breath sounds clear to auscultation       Cardiovascular hypertension (no meds required),  Rhythm:Regular Rate:Normal     Neuro/Psych Dementia x 10 years    GI/Hepatic negative GI ROS, Neg liver ROS,   Endo/Other    Renal/GU negative Renal ROS     Musculoskeletal   Abdominal   Peds  Hematology Hb 10.9   Anesthesia Other Findings   Reproductive/Obstetrics                            Anesthesia Physical Anesthesia Plan  ASA: III  Anesthesia Plan: General   Post-op Pain Management:    Induction: Intravenous  Airway Management Planned: Oral ETT  Additional Equipment:   Intra-op Plan:   Post-operative Plan: Extubation in OR  Informed Consent: I have reviewed the patients History and Physical, chart, labs and discussed the procedure including the risks, benefits and alternatives for the proposed anesthesia with the patient or authorized representative who has indicated his/her understanding and acceptance.   Dental advisory given and Consent reviewed with POA  Plan Discussed with: CRNA and Surgeon  Anesthesia Plan Comments: (Plan routine monitors, GETA Discussed with son, POA)        Anesthesia Quick Evaluation

## 2016-07-01 NOTE — Progress Notes (Signed)
Initial Nutrition Assessment  DOCUMENTATION CODES:   Not applicable  INTERVENTION:  Once diet advances, provide Ensure Enlive po BID, each supplement provides 350 kcal and 20 grams of protein.  NUTRITION DIAGNOSIS:   Increased nutrient needs related to  (post op healing) as evidenced by estimated needs.  GOAL:   Patient will meet greater than or equal to 90% of their needs  MONITOR:   Supplement acceptance, Diet advancement, Labs, Weight trends, Skin, I & O's  REASON FOR ASSESSMENT:   Consult Assessment of nutrition requirement/status  ASSESSMENT:   Pt. with PMH of Alzheimer dementia, COPD, HTN; admitted on 06/30/2016, with complaint of unwitnessed fall, was found to have subcapital fracture of the right femoral neck.  Pt is currently NPO for surgery today. Pt was asleep during time of visit and did not wake to RD assessment. Family at bedside. He reports pt usually eats well PTA with usual consumption of at least 3 meals a day with a protein shake at least once daily. He reports pt has been having gradual weight loss of 20 lbs over the past 1 year. Pt with a reported 12% weight loss (not significant for time frame). RD to order Ensure post op to aid in healing and adequate nutrition.   Pt with no observed significant fat or muscle mass loss.   Labs and medications reviewed.   Diet Order:  Diet NPO time specified Except for: Sips with Meds  Skin:  Reviewed, no issues  Last BM:  2/28  Height:   Ht Readings from Last 1 Encounters:  06/30/16 5\' 5"  (1.651 m)    Weight:   Wt Readings from Last 1 Encounters:  06/30/16 144 lb (65.3 kg)    Ideal Body Weight:  56.8 kg  BMI:  Body mass index is 23.96 kg/m.  Estimated Nutritional Needs:   Kcal:  1700-1900  Protein:  75-85 grams  Fluid:  1.7 - 1.9 L/day  EDUCATION NEEDS:   No education needs identified at this time  Roslyn SmilingStephanie Telma Pyeatt, MS, RD, LDN Pager # [phone] After hours/ weekend pager # [phone]

## 2016-07-01 NOTE — Op Note (Signed)
Pre Op Dx: R femoral neck fracture   Post Op Dx: Same   Procedure: R hemi-hip arthroplasty using DePuy 46 mm monopolar head, +0 neck, #2 Tri-Lock stem Surgeon: Nestor Lewandowsky, MD  Assistant: Tomi Likens. [name]  (present throughout entire procedure and necessary for timely completion of the procedure)   Anesthesia: Gen.  EBL: 300 mL  Fluids: 1500 cc of crystalloid  Tranexamic Acid: 1 g IV, 2 g topical Complicationss: None  Indications: Severe Alzheimer's dementia patient slipped and fell at skilled nursing home on the evening of 06/29/16. Was transported to the emergency room with a presumed R hip fracture confirmed by x-ray. To decrease pain increase function and decreased morbidity have recommended and the patient has consented to hemi-hip arthroplasty. Risks and benefits of surgery have been discussed with the patient and family. All questions have been answered.   Procedure: Patient was identified by arm band receive preoperative IV antibiotics in the holding area at the hospital. Taken to operating room for the appropriate anesthetic monitors were attached and general  anesthesia induced, on a hospital transport gurney. Small traction boots were applied to her feet. The patient was then transferred to the [facility] table. And the right hip and thigh prepped and draped in usual sterile fashion. A timeout procedure was performed. We began the procedure by making a 12 cm incision starting or centimeters lateral to the ASIS and going distally and diagonally towards the shaft of the femur. Small bleeders in the skin and subcutaneous tissue identified and cauterized the fascia over the tensor fascia lata muscle was then incised and reflected anteriorly isolating the tensor fascia lata. A small Cobra retractor was placed on the superior aspect of the femoral neck, cerebellar retractor was placed between the tensor fascia lata and the rectus femoris isolating the anterior circumflex vessels which were then  cauterized. A second larger Cobra was placed along the inferior femoral neck. A right angle Rogelia Rohrer was placed over the anterior aspect of the pelvis just in front of the acetabulum we then incised and teed the capsule starting at the anterior aspect of the acetabulum going distally along the femoral neck and then inferiorly and superiorly along the reflection exposing the femoral neck fracture. A standard neck cut was then performed 1 fingerbreadth above the lesser trochanter and fragment of bone removed. The corkscrew was then placed in the femoral head which was likewise removed without difficulty. We then trialed with a 45 and 46 monopolar head 46 had excellent fit and fill. At this point traction was released and the hip was actually rotated 120 and then the out rigger was lowered to the floor and into abduction exposing the proximal femur. Along the posterior aspect of the femoral neck a large angled Hohmann retractor was placed and then the third toe and placed over the top of the greater trochanter enhancing our exposure. We then removed tissue from the inferior posterior aspect of the femoral neck which gave [place] a nice release and allowed [place] to insert the cookie cutter into the proximal femur followed by the chili pepper broach. I then sequentially broached up to a #2 broach which had excellent fit and fill and performed a trial with a +0 46 mm monopolar head and a 2 broach. C-arm images confirmed good fit of the broach and fill of the proximal femur. Leg lengths were measured. The right lower extremity was 2-3 mm longer than the left which was intentional the enhanced stability. At this point we  dislocated the hip with traction and external rotation the foot was placed back on the floor into abduction the broach was removed and the wound thoroughly irrigated out with normal saline solution briefly packed with a trans-Amick acid sponge suctioned again and extra was placed on the bony surface of the proximal  femur and into the soft tissues with a mixture of 20 mL X Burrell and 30 mL half percent Marcaine with epinephrine. We then hammered into place a #2 Tri-Lock stem with excellent fit and fill and stability. A +0 46 mm monopolar ball was then hammered onto the stem and the hip again reduced. The hip could not be dislocated in external rotation to 100 without significant traction. The rest of the expose and injection of the soft tissues about the hip and subcutaneous tissues. The wound was irrigated out with normal saline solution and then closed in layers with running 0 Vicryl in the tensor fascia lata fascia, 2-0 Vicryl in the subcutaneous tissue and 3-0 Vicryl subcuticular followed by an Aquacel dressing. At this point the patient was awake and extubated and taken to the recovery room without difficulty.

## 2016-07-01 NOTE — Anesthesia Postprocedure Evaluation (Signed)
Anesthesia Post Note  Patient: [name]  Procedure(s) Performed: Procedure(s) (LRB): ANTERIOR APPROACH HEMI HIP ARTHROPLASTY (Right)  Patient location during evaluation: PACU Anesthesia Type: General Level of consciousness: awake Pain management: pain level controlled Respiratory status: spontaneous breathing Cardiovascular status: stable Anesthetic complications: no       Last Vitals:  Vitals:   07/01/16 1715 07/01/16 1730  BP: 104/63 (!) 129/95  Pulse:    Resp:    Temp:      Last Pain:  Vitals:   07/01/16 1710  TempSrc:   PainSc: 0-No pain                 Shanaye Rief

## 2016-07-01 NOTE — Clinical Social Work Note (Signed)
Clinical Social Work Assessment  Patient Details  Name: [name] MRN: [id number] Date of Birth: 08/02/1950  Date of referral:  07/01/16               Reason for consult:  Facility Placement                Permission sought to share information with:  Family Supports Permission granted to share information::     Name::     Psychologist, sport and exerciseJesse  Agency::     Relationship::  Son  SolicitorContact Information:     Housing/Transportation Living arrangements for the past 2 months:  Skilled Building surveyorursing Facility Source of Information:  Adult Children Patient Interpreter Needed:  None Criminal Activity/Legal Involvement Pertinent to Current Situation/Hospitalization:  No - Comment as needed Significant Relationships:  Adult Children Lives with:  Facility Resident Do you feel safe going back to the place where you live?  Yes Need for family participation in patient care:     Care giving concerns:  CSW spoke with pt's son via telephone. [name] is healthcare POA.   Social Worker assessment / plan:  CSW spoke with pt's son via telephone. Pt is disoriented x4. Pt is from [facility]. [name] states the plan is for pt to return at d/c. CSW will follow up with facility as well as [facility].  Employment status:    Insurance information:  Other (Comment Required) ([facility]) PT Recommendations:  Skilled Nursing Facility Information / Referral to community resources:  Skilled Nursing Facility  Patient/Family's Response to care:  [name] verbalized understanding of CSW role and expressed appreciation for support. [name] denies any concern regarding pt care at this time.  Patient/Family's Understanding of and Emotional Response to Diagnosis, Current Treatment, and Prognosis:  [name] understanding and realistic regarding pt's physical limitations. [name] aware and understanding of the need for return to SNF at d/c. [name] is agreeable [facility]. Pt's  son denies any concern regarding treatment plan at this time.   Emotional Assessment Appearance:  Appears stated age Attitude/Demeanor/Rapport:  Unable to Assess Affect (typically observed):  Unable to Assess Orientation:   (Disorriented x4) Alcohol / Substance use:  Not Applicable Psych involvement (Current and /or in the community):  No (Comment)  Discharge Needs  Concerns to be addressed:  No discharge needs identified Readmission within the last 30 days:  No Current discharge risk:  Dependent with Mobility Barriers to Discharge:  Continued Medical Work up   Safeway IncBridget A Mayton, LCSW 07/01/2016, 11:36 AM

## 2016-07-01 NOTE — NC FL2 (Signed)
[place] MEDICAID FL2 LEVEL OF CARE SCREENING TOOL     IDENTIFICATION  Patient Name: [name] Birthdate: 1950/11/23 Sex: female Admission Date (Current Location): 06/30/2016  [facility] [place]   Facility and Address:  [facility], [address], [place], [place] [address]      Provider Number: [id number]  Attending Physician Name and Address:  Rolly SalterPranav M Patel, MD  Relative Name and Phone Number:       Current Level of Care: Hospital Recommended Level of Care: Skilled Nursing Facility Prior Approval Number:    Date Approved/Denied:   PASRR Number:  [id number]   Discharge Plan: SNF    Current Diagnoses: Patient Active Problem List   Diagnosis Date Noted  . Hypertension 06/30/2016  . COPD (chronic obstructive pulmonary disease) (HCC) 06/30/2016  . Closed displaced fracture of right femoral neck (HCC) 06/30/2016  . Displaced fracture of right femoral neck (HCC) 06/30/2016  . Alzheimer's disease 01/01/2014  . 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    Orientation RESPIRATION BLADDER Height & Weight      (Disorriented x4)  Normal Indwelling catheter (Urethal Catheter, 14Fr tube., 10mL Balloon) Weight: 144 lb (65.3 kg) Height:  5\' 5"  (165.1 cm)  BEHAVIORAL SYMPTOMS/MOOD NEUROLOGICAL BOWEL NUTRITION STATUS      Continent  (Please see discharge summary)  AMBULATORY STATUS COMMUNICATION OF NEEDS Skin   Maximum Assist Verbally Normal                       Personal Care Assistance Level of Assistance  Bathing, Feeding, Dressing Bathing Assistance: Extensive Assistance Feeding Assistance: Limited Assistance Dressing Assistance: Extensive Assistance     Functional Limitations Info  Sight, Hearing, Speech Sight Info: Adequate Hearing Info: Adequate Speech Info: Adequate, mumbles at times    SPECIAL CARE FACTORS FREQUENCY  PT (By licensed PT), OT (By licensed OT)      PT Frequency: 5x week  OT Frequency: 5x week            Contractures Contractures Info: Not present    Additional Factors Info  Code Status, Allergies Code Status Info: Full Code Allergies Info:  No known allergies           Current Medications (07/01/2016):  This is the current hospital active medication list Current Facility-Administered Medications  Medication Dose Route Frequency Provider Last Rate Last Dose  . 0.9 %  sodium chloride infusion   Intravenous Continuous Hollice EspySendil K Krishnan, MD      . 0.9 %  sodium chloride infusion   Intravenous Continuous Jiles Haroldanielle Laliberte, PA-C 100 mL/hr at 07/01/16 0602    . ceFAZolin (ANCEF) IVPB 2g/100 mL premix  2 g Intravenous On Call to OR Jiles Haroldanielle Laliberte, PA-C      . chlorhexidine (HIBICLENS) 4 % liquid 4 application  60 mL Topical Once Jiles Haroldanielle Laliberte, PA-C      . Chlorhexidine Gluconate Cloth 2 % PADS 6 each  6 each Topical Daily Rolly SalterPranav M Patel, MD      . HYDROcodone-acetaminophen (NORCO/VICODIN) 5-325 MG per tablet 1-2 tablet  1-2 tablet Oral Q6H PRN Hollice EspySendil K Krishnan, MD   2 tablet at 06/30/16 2220  . mirtazapine (REMERON) tablet 7.5 mg  7.5 mg Oral Q breakfast Hollice EspySendil K Krishnan, MD      . morphine 2 MG/ML injection 0.5 mg  0.5 mg Intravenous Q2H PRN Hollice EspySendil K Krishnan, MD      . mupirocin  ointment (BACTROBAN) 2 % 1 application  1 application Nasal BID Rolly Salter, MD      . polyethylene glycol (MIRALAX / GLYCOLAX) packet 17 g  17 g Oral Daily PRN Hollice Espy, MD      . povidone-iodine 10 % swab 2 application  2 application Topical Once Jiles Harold, PA-C      . QUEtiapine (SEROQUEL) tablet 12.5 mg  12.5 mg Oral BID Hollice Espy, MD   12.5 mg at 06/30/16 2220     Discharge Medications: Please see discharge summary for a list of discharge medications.  Relevant Imaging Results:  Relevant Lab Results:   Additional Information SSN: [id number]  Volney American, LCSW

## 2016-07-01 NOTE — Transfer of Care (Signed)
Immediate Anesthesia Transfer of Care Note  Patient: [name]  Procedure(s) Performed: Procedure(s): ANTERIOR APPROACH HEMI HIP ARTHROPLASTY (Right)  Patient Location: PACU  Anesthesia Type:General  Level of Consciousness: awake  Airway & Oxygen Therapy: Patient Spontanous Breathing and Patient connected to nasal cannula oxygen  Post-op Assessment: Report given to RN and Post -op Vital signs reviewed and stable  Post vital signs: Reviewed and stable  Last Vitals:  Vitals:   07/01/16 0557 07/01/16 1257  BP: (!) 125/58 135/65  Pulse: 77 87  Resp: 16 18  Temp: 37.3 C 37.3 C    Last Pain:  Vitals:   07/01/16 1257  TempSrc: Oral         Complications: No apparent anesthesia complications

## 2016-07-01 NOTE — Progress Notes (Signed)
Report called to Stevphen MeuseKathy Gallman-RN in short stay. All questions/concerns addressed. Son updated. Will continue to monitor

## 2016-07-01 NOTE — Discharge Instructions (Signed)

## 2016-07-01 NOTE — Anesthesia Procedure Notes (Addendum)
Procedure Name: Intubation Date/Time: 07/01/2016 3:23 PM Performed by: Candis Shine Pre-anesthesia Checklist: Patient identified, Emergency Drugs available, Suction available and Patient being monitored Patient Re-evaluated:Patient Re-evaluated prior to inductionOxygen Delivery Method: Circle System Utilized Preoxygenation: Pre-oxygenation with 100% oxygen Intubation Type: IV induction Ventilation: Mask ventilation without difficulty Laryngoscope Size: Mac and 3 Grade View: Grade III Tube type: Oral Tube size: 7.0 mm Number of attempts: 1 Airway Equipment and Method: Bougie stylet Placement Confirmation: ETT inserted through vocal cords under direct vision,  positive ETCO2 and breath sounds checked- equal and bilateral Secured at: 21 cm Tube secured with: Tape Dental Injury: Teeth and Oropharynx as per pre-operative assessment

## 2016-07-01 NOTE — Progress Notes (Signed)
[facility] Progress Note  Patient: [name] ZOX:[id number]   PCP: Thane Edu, MD DOB: 09/08/1950   DOA: 06/30/2016   DOS: 07/01/2016   Date of Service: the patient was seen and examined on 07/01/2016   Subjective: Patient is drowsy and lethargic, nonverbal since admission. At her baseline she requires assistance with feeding, bathing, cleaning her. She is incontinent and unable to tell her requirements. She also communicates minimally and whenever she does as per her son it is nonsensical.  Brief hospital course: Pt. with PMH of Alzheimer dementia, COPD, HTN; admitted on 06/30/2016, with complaint of unwitnessed fall, was found to have subcapital fracture of the right femoral neck. Currently further plan is to await for recommendations from orthopedics.  Assessment and Plan: 1. Closed displaced fracture of right femoral neck (HCC) Sustained after a unwitnessed fall. Ortho was consulted, patient was transferred from [facility] long hospital to [facility] for right hip hemiarthroplasty. Currently nothing by mouth.  2. Preoperative medical evaluation No Coronary revascularization/CVA within 5 years. No Recent stress test. Cannot Climb flight of stair, participates in recreational activity,does household chores. No Prior adverse event with anesthesia. No Alcohol use, drug use.  A) Cardiac risk: Based on RCRI the patient is a moderate risk for adverse Cardiac outcome from surgery. Per Chales Abrahams score Estimated Risk Probability for Perioperative Myocardial Infarction or Cardiac Arrest 3.12 % Recommend no further work up.  B) Pulmonary risk: The pt has history of COPD Recommend optimization of lung function with use of incentive spirometry and good pulmunary toilet. continue use of nebulizer/inhaler.  C) Bleeding risk The pt will be place on SCD for DVT prophylaxis to minimize the bleeding risk. Will request Surgeon to please Order Lovenox/DVT prophylaxis of his/her choice  when OK from Surgeon's standpoint post op.  3. Alzheimer's dementia Severe and appears end-stage. Unwitnessed fall, acute encephalopathy Patient requires a significant assistance at her baseline independent for her regular ADL. I am concerned regarding postoperative course with therapy, her son also mentions that the patient would probably not be participating with the therapy or complying. I am also concerned regarding postoperative nutritional status. I discussed with her son who is the POA regarding this concern, and he would still like to go for the surgery. I would get a CAT scan of the head to rule out any acute intracranial abnormality. Her prognosis is guarded given above concern. Continue Seroquel.  4. COPD. Does not appear to have any active exacerbation. We will use when necessary nebulizers.  Bowel regimen: last BM 06/30/2016 Diet: NPO DVT Prophylaxis: mechanical compression device  Advance goals of care discussion: full code  Family Communication: family was present at bedside, at the time of interview. The pt provided permission to discuss medical plan with the family. Opportunity was given to ask question and all questions were answered satisfactorily.   Disposition:  To be determine pending postoperative recovery.  Consultants: orthopedics Procedures: none  Antibiotics: Anti-infectives    Start     Dose/Rate Route Frequency Ordered Stop   07/01/16 0600  ceFAZolin (ANCEF) IVPB 2g/100 mL premix     2 g 200 mL/hr over 30 Minutes Intravenous On call to O.R. 06/30/16 1819 07/02/16 0559        Objective: Physical Exam: Vitals:   06/30/16 1721 06/30/16 2042 07/01/16 0025 07/01/16 0557  BP: 145/85 (!) 146/69 (!) 154/67 (!) 125/58  Pulse: 91 85 76 77  Resp: 16 16 16 16   Temp: 100.7 F (38.2 C) 99.4 F (37.4 C)  98.9 F (37.2 C) 99.2 F (37.3 C)  TempSrc: Oral Axillary Oral Oral  SpO2: 100% 100% 100%   Weight:      Height:        Intake/Output Summary  (Last 24 hours) at 07/01/16 0911 Last data filed at 07/01/16 0602  Gross per 24 hour  Intake          1143.33 ml  Output              300 ml  Net           843.33 ml   Filed Weights   06/30/16 1139  Weight: 65.3 kg (144 lb)    General: Drowsy and lethargic, opening her eyes even on sternal rub, noncommunicative Eyes: PERRL, Conjunctiva normal ENT: Oral Mucosa clear dry. Neck: difficult to assess JVD, no Abnormal Mass Or lumps Cardiovascular: S1 and S2 Present, no Murmur, Respiratory: Bilateral Air entry equal and Decreased, no use of accessory muscle, Clear to Auscultation, no Crackles, no wheezes Abdomen: Bowel Sound present, Soft and no tenderness Skin: no redness, no Rash, no induration Extremities: no Pedal edema, no calf tenderness Neurologic: Drowsy and lethargic, opening her eyes even on sternal rub, noncommunicative  Data Reviewed: CBC:  Recent Labs Lab 06/30/16 1243 07/01/16 0530  WBC 12.4* 8.6  NEUTROABS 11.4*  --   HGB 12.3 10.9*  HCT 37.9 34.5*  MCV 82.9 84.8  PLT 231 207   Basic Metabolic Panel:  Recent Labs Lab 06/30/16 1243 07/01/16 0530  NA 138 139  K 4.0 4.0  CL 103 108  CO2 28 26  GLUCOSE 165* 120*  BUN 15 15  CREATININE 0.68 0.89  CALCIUM 9.4 8.9    Liver Function Tests: No results for input(s): AST, ALT, ALKPHOS, BILITOT, PROT, ALBUMIN in the last 168 hours. No results for input(s): LIPASE, AMYLASE in the last 168 hours. No results for input(s): AMMONIA in the last 168 hours. Coagulation Profile:  Recent Labs Lab 06/30/16 1243  INR 0.98   Cardiac Enzymes:  Recent Labs Lab 06/30/16 1243  CKTOTAL 194   BNP (last 3 results) No results for input(s): PROBNP in the last 8760 hours.  CBG: No results for input(s): GLUCAP in the last 168 hours.  Studies: Chest Portable 1 View  Result Date: 06/30/2016 CLINICAL DATA:  Unwitnessed fall. EXAM: PORTABLE CHEST 1 VIEW COMPARISON:  None. FINDINGS: The heart size and mediastinal  contours are within normal limits. Both lungs are clear. No pneumothorax or pleural effusion is noted. Atherosclerosis of thoracic aorta is noted. The visualized skeletal structures are unremarkable. IMPRESSION: No acute cardiopulmonary abnormality seen.  Aortic atherosclerosis. Electronically Signed   By: Lupita RaiderJames  Green Jr, M.D.   On: 06/30/2016 17:51   Dg Hip Unilat  With Pelvis 2-3 Views Right  Result Date: 06/30/2016 CLINICAL DATA:  Deformity of the right lower extremity after a fall from bed this morning. EXAM: DG HIP (WITH OR WITHOUT PELVIS) 2-3V RIGHT COMPARISON:  None. FINDINGS: There is an angulated overriding subcapital fracture of the proximal right femoral neck. Pelvic bones are intact. IMPRESSION: Angulated overriding subcapital fracture of the right femoral neck. Electronically Signed   By: Francene BoyersJames  Maxwell M.D.   On: 06/30/2016 12:19     Scheduled Meds: .  ceFAZolin (ANCEF) IV  2 g Intravenous On Call to OR  . chlorhexidine  60 mL Topical Once  . Chlorhexidine Gluconate Cloth  6 each Topical Daily  . mirtazapine  7.5 mg Oral Q breakfast  . mupirocin  ointment  1 application Nasal BID  . povidone-iodine  2 application Topical Once  . QUEtiapine  12.5 mg Oral BID   Continuous Infusions: . sodium chloride    . sodium chloride 100 mL/hr at 07/01/16 0602   PRN Meds: HYDROcodone-acetaminophen, morphine injection, polyethylene glycol  Time spent: 30 minutes  Author: Lynden Oxford, MD [facility] Pager: [phone] 07/01/2016 9:11 AM  If 7PM-7AM, please contact night-coverage at [email or web address], password [facility]

## 2016-07-02 ENCOUNTER — Encounter (HOSPITAL_COMMUNITY): Payer: Self-pay | Admitting: Orthopedic Surgery

## 2016-07-02 LAB — BASIC METABOLIC PANEL
ANION GAP: 8 (ref 5–15)
BUN: 10 mg/dL (ref 6–20)
CALCIUM: 8.4 mg/dL — AB (ref 8.9–10.3)
CHLORIDE: 105 mmol/L (ref 101–111)
CO2: 24 mmol/L (ref 22–32)
Creatinine, Ser: 0.69 mg/dL (ref 0.44–1.00)
GFR calc non Af Amer: >60 mL/min (ref >60)
GLUCOSE: 101 mg/dL — AB (ref 65–99)
Potassium: 3.7 mmol/L (ref 3.5–5.1)
Sodium: 137 mmol/L (ref 135–145)

## 2016-07-02 LAB — CBC
HEMATOCRIT: 32.7 % — AB (ref 36.0–46.0)
HEMOGLOBIN: 10.3 g/dL — AB (ref 12.0–15.0)
MCH: 27 pg (ref 26.0–34.0)
MCHC: 31.5 g/dL (ref 30.0–36.0)
MCV: 85.6 fL (ref 78.0–100.0)
Platelets: 181 10*3/uL (ref 150–400)
RBC: 3.82 MIL/uL — AB (ref 3.87–5.11)
RDW: 15.7 % — ABNORMAL HIGH (ref 11.5–15.5)
WBC: 9.8 10*3/uL (ref 4.0–10.5)

## 2016-07-02 LAB — MAGNESIUM: MAGNESIUM: 1.5 mg/dL — AB (ref 1.7–2.4)

## 2016-07-02 MED ORDER — MAGNESIUM SULFATE 2 GM/50ML IV SOLN
2.0000 g | Freq: Once | INTRAVENOUS | Status: AC
  Administered 2016-07-02: 2 g via INTRAVENOUS
  Filled 2016-07-02: qty 50

## 2016-07-02 MED ORDER — DOCUSATE SODIUM 100 MG PO CAPS: 100.0000 mg | ORAL_CAPSULE | Freq: Two times a day (BID) | ORAL | 0 refills | Status: AC

## 2016-07-02 MED ORDER — POTASSIUM CHLORIDE CRYS ER 20 MEQ PO TBCR
40.0000 meq | EXTENDED_RELEASE_TABLET | Freq: Once | ORAL | Status: AC
  Administered 2016-07-02: 40 meq via ORAL
  Filled 2016-07-02: qty 2

## 2016-07-02 NOTE — Evaluation (Signed)
Physical Therapy Evaluation Patient Details Name: [name] MRN: [id number] DOB: 1951/02/18 Today's Date: 07/02/2016   History of Present Illness   Severe Alzheimer's dementia patient slipped and fell at skilled nursing home on the evening of 06/29/16. she underwent Rt HHA. PMH also includes HTN and COPD.  Clinical Impression  Pt with significant baseline cognitive impairments.  Pt unable to follow commands, respond to her name being called or make eye contact.  She uttered a few nonsensical words during session.  Did not attempt out of bed transfer as she was lethargic/difficult to arouse and high fall risk with prior h/o falls.  Upon entering room, pt's bedding wet with urine, therefore performed bed mobility to make pt comfortable and clean.  [name] present during session. Will continue to follow patient while on this venue of care to progress mobility.    Follow Up Recommendations SNF;Supervision/Assistance - 24 hour    Equipment Recommendations  None recommended by PT    Recommendations for Other Services       Precautions / Restrictions Precautions Precautions: Anterior Hip Precaution Booklet Issued: No Restrictions Weight Bearing Restrictions: Yes RLE Weight Bearing: Weight bearing as tolerated      Mobility  Bed Mobility Overal bed mobility: Needs Assistance Bed Mobility: Rolling Rolling: +2 for physical assistance;Total assist         General bed mobility comments: used pad to assist with turning patient. pt = 0%  Transfers                    Ambulation/Gait                Stairs            Wheelchair Mobility    Modified Rankin (Stroke Patients Only)       Balance Overall balance assessment: History of Falls                                           Pertinent Vitals/Pain Pain Assessment: Faces Faces Pain Scale: Hurts a little bit Pain Location: Rt hip Pain Intervention(s): Repositioned    Home Living  Family/patient expects to be discharged to:: Skilled nursing facility                      Prior Function Level of Independence: Needs assistance   Gait / Transfers Assistance Needed: supervision without device, multiple recent falls within past few weeks  ADL's / Homemaking Assistance Needed: assist  Comments: locked memory care unit     Hand Dominance        Extremity/Trunk Assessment   Upper Extremity Assessment Upper Extremity Assessment: Generalized weakness;Difficult to assess due to impaired cognition    Lower Extremity Assessment Lower Extremity Assessment: Generalized weakness;Difficult to assess due to impaired cognition    Cervical / Trunk Assessment Cervical / Trunk Assessment: Kyphotic  Communication   Communication: Receptive difficulties;Expressive difficulties  Cognition Arousal/Alertness: Lethargic Behavior During Therapy: Flat affect Overall Cognitive Status: History of cognitive impairments - at baseline                 General Comments: did not respond to her name being called    General Comments General comments (skin integrity, edema, etc.): pt's son reports multiple falls within past few weeks while she had been walking.  he also reports she does not move her  head or eyes much and walks without looking.  Assisted nursing staff to change bedding as pt's bed had puddle of urine, wet gown, pt slightly shivering    Exercises     Assessment/Plan    PT Assessment Patient needs continued PT services  PT Problem List Decreased strength;Decreased activity tolerance;Decreased balance;Decreased mobility;Decreased cognition;Decreased knowledge of use of DME;Decreased safety awareness;Decreased knowledge of precautions       PT Treatment Interventions DME instruction;Gait training;Functional mobility training;Therapeutic activities;Therapeutic exercise;Balance training;Cognitive remediation;Patient/family education    PT Goals (Current goals  can be found in the Care Plan section)  Acute Rehab PT Goals Patient Stated Goal: none stated except to return to same facility PT Goal Formulation: With family Time For Goal Achievement: 07/17/16 Potential to Achieve Goals: Fair    Frequency Min 3X/week   Barriers to discharge        Co-evaluation               End of Session   Activity Tolerance: Other (comment) (limited by cognition) Patient left: in bed;with call bell/phone within reach;with bed alarm set;with family/visitor present Nurse Communication: Mobility status;Need for lift equipment PT Visit Diagnosis: History of falling (Z91.81);Other abnormalities of gait and mobility (R26.89)         Time: [id number] PT Time Calculation (min) (ACUTE ONLY): 29 min   Charges:   PT Evaluation $PT Eval Moderate Complexity: 1 Procedure PT Treatments $Therapeutic Activity: 8-22 mins   PT G CodesCarrie Mew:    n/a   Agapita Savarino M Unknown Schleyer, PT [phone]  Aniaya Bacha 07/02/2016, 1:29 PM

## 2016-07-02 NOTE — Progress Notes (Signed)
PATIENT ID: Caesar ChestnutMary R Hollomon  MRN: [id number]  DOB/AGE:  1950/08/11 / 66 y.o.  1 Day Post-Op Procedure(s) (LRB): ANTERIOR APPROACH HEMI HIP ARTHROPLASTY (Right)    PROGRESS NOTE Subjective: Patient's son in room all night reports, no Nausea, no Vomiting, Taking PO fluids well. Denies SOB, Chest or Calf Pain. Using Incentive Spirometer, PAS in place. Ambulate WBAT with PT Patient with severe Alzheimer's dementia and is noncommunicative. .    Objective: Vital signs in last 24 hours: Vitals:   07/01/16 1844 07/01/16 1942 07/02/16 0033 07/02/16 0615  BP: 132/70 137/77 (!) 145/65 (!) 147/63  Pulse: (!) 58 95 87 86  Resp: 16 18 16 16   Temp: 99.1 F (37.3 C) 99.1 F (37.3 C) (!) 100.7 F (38.2 C) 98.9 F (37.2 C)  TempSrc: Axillary Axillary Oral Oral  SpO2: 97% 100% 100% 100%  Weight:      Height:          Intake/Output from previous day: I/O last 3 completed shifts: In: 2543.3 [I.V.:2543.3] Out: 1350 [Urine:1200; Blood:150]   Intake/Output this shift: No intake/output data recorded.   LABORATORY DATA:  Recent Labs  06/30/16 1243 07/01/16 0530 07/02/16 0528  WBC 12.4* 8.6 9.8  HGB 12.3 10.9* 10.3*  HCT 37.9 34.5* 32.7*  PLT 231 207 181  NA 138 139 137  K 4.0 4.0 3.7  CL 103 108 105  CO2 28 26 24   BUN 15 15 10   CREATININE 0.68 0.89 0.69  GLUCOSE 165* 120* 101*  INR 0.98  --   --   CALCIUM 9.4 8.9 8.4*    Examination: Neurologically intact ABD soft Neurovascular intact Sensation intact distally Intact pulses distally Dorsiflexion/Plantar flexion intact Incision: dressing C/D/I No cellulitis present Compartment soft} XR AP&Lat of hip shows well placed\fixed THA  Assessment:   1 Day Post-Op Procedure(s) (LRB): ANTERIOR APPROACH HEMI HIP ARTHROPLASTY (Right) ADDITIONAL DIAGNOSIS:  Expected Acute Blood Loss Anemia, severe Alzheimer's dementia, history of COPD, hypertension  Plan: PT/OT WBAT, THA  DVT Prophylaxis: SCDx72 hrs, ASA 325 mg BID x 2  weeks  DISCHARGE PLAN: Skilled Nursing Facility/Rehab  DISCHARGE NEEDS: HHPT, Walker and 3-in-1 comode seat

## 2016-07-02 NOTE — Discharge Summary (Signed)
[facility] Discharge Summary   Patient: [name] ZOX:[id number]   PCP: Thane Edu, MD DOB: 12-24-1950   Date of admission: 06/30/2016   Date of discharge:  07/02/2016    Discharge Diagnoses:  Principal Problem:   Closed displaced fracture of right femoral neck (HCC) Active Problems:   Alzheimer's disease   Hypertension   COPD (chronic obstructive pulmonary disease) (HCC)   Displaced fracture of right femoral neck (HCC)   Admitted From: SNF Disposition:  SNF  Recommendations for Outpatient Follow-up:  1. Please follow-up with PCP in one week as well as orthopedic in 2 weeks.   Follow-up Information    Nestor Lewandowsky, MD Follow up in 2 week(s).   Specialty:  Orthopedic Surgery Contact information: [address] [place] [address] [phone]        Thane Edu, MD. Schedule an appointment as soon as possible for a visit in 1 week(s).   Specialty:  Family Medicine Contact information: [address] [place] [address] [phone]          Diet recommendation: Cardiac diet  Activity: The patient is advised to gradually reintroduce usual activities.  Discharge Condition: good  Code Status: Full code  History of present illness: As per the H and P dictated on admission, "[name] is a 66 y.o. female with medical history significant of Alzheimer's dementia and hypertension who resides in a memory care unit and was found today on the ground on her left side. Patient had an unwitnessed fall. She was unable to stand. She brought into the emergency room."  Hospital Course:   Summary of her active problems in the hospital is as following. 1. Closed displaced fracture of right femoral neck (HCC) Sustained after a unwitnessed fall. Ortho was consulted, patient was transferred from [facility] to [facility] for right hip hemiarthroplasty. Tolerated procedure well, post op Hb stable. Renal function and oral intake stable  as well.  2. Preoperative medical evaluation Based on RCRI the patient is a moderate risk for adverse Cardiac outcome from surgery. Per Chales Abrahams score Estimated Risk Probability for Perioperative Myocardial Infarction or Cardiac Arrest 3.12 %  3. Alzheimer's dementia Severe and appears end-stage. Unwitnessed fall, acute encephalopathy Patient requires significant assistance at her baseline and is totally dependent for her regular ADL. I am concerned regarding postoperative course with therapy, her son also mentions that the patient would probably not be participating with the therapy or complying. I am also concerned regarding postoperative nutritional status. I discussed with her son who is the POA regarding this concern, and he would still like to go for the surgery. Her prognosis is guarded given above concern. Continue Seroquel.  4. COPD. Does not appear to have any active exacerbation. Continue home regimen   All other chronic medical condition were stable during the hospitalization.  Patient was seen by physical therapy, who recommended SNF, which was arranged by Child psychotherapist and case Production designer, theatre/television/film. On the day of the discharge the patient's vitals were stable, and no other acute medical condition were reported by patient. the patient was felt safe to be discharge at SNF with therapy.  Procedures and Results:   ANTERIOR APPROACH HEMI HIP ARTHROPLASTY (Right)  Consultations:  Orthopedics   DISCHARGE MEDICATION: Current Discharge Medication List    START taking these medications   Details  aspirin EC 325 MG tablet Take 1 tablet (325 mg total) by mouth 2 (two) times daily. Qty: 30 tablet, Refills: 0    docusate  sodium (COLACE) 100 MG capsule Take 1 capsule (100 mg total) by mouth 2 (two) times daily. Qty: 10 capsule, Refills: 0    HYDROcodone-acetaminophen (NORCO) 5-325 MG tablet Take 1-2 tablets by mouth every 4 (four) hours as needed. Qty: 60 tablet, Refills: 0      tiZANidine (ZANAFLEX) 2 MG tablet Take 1 tablet (2 mg total) by mouth every 6 (six) hours as needed for muscle spasms. Qty: 60 tablet, Refills: 0      CONTINUE these medications which have NOT CHANGED   Details  alum & mag hydroxide-simeth (GNP ANTACID ANTI-GAS) 200-200-20 MG/5ML suspension Take 30 mLs by mouth daily as needed for indigestion.    azelastine (ASTELIN) 0.1 % nasal spray Place 2 sprays into both nostrils daily after breakfast. Use in each nostril as directed    cholecalciferol (VITAMIN D) 1000 units tablet Take 1,000 Units by mouth daily with breakfast.    mirtazapine (REMERON) 7.5 MG tablet Take 7.5 mg by mouth daily with breakfast.    QUEtiapine (SEROQUEL) 25 MG tablet Take 12.5 mg by mouth 2 (two) times daily.       No Known Allergies Discharge Instructions    Diet - low sodium heart healthy    Complete by:  As directed    Discharge instructions    Complete by:  As directed    It is important that you read following instructions as well as go over your medication list with RN to help you understand your care after this hospitalization.  Discharge Instructions: Please follow-up with PCP in one week  Please request your primary care physician to go over all Hospital Tests and Procedure/Radiological results at the follow up,  Please get all Hospital records sent to your PCP by signing hospital release before you go home.   Do not take more than prescribed Pain, Sleep and Anxiety Medications. You were cared for by a hospitalist during your hospital stay. If you have any questions about your discharge medications or the care you received while you were in the hospital after you are discharged, you can call the unit and ask to speak with the hospitalist on call if the hospitalist that took care of you is not available.  Once you are discharged, your primary care physician will handle any further medical issues. Please note that NO REFILLS for any discharge medications  will be authorized once you are discharged, as it is imperative that you return to your primary care physician (or establish a relationship with a primary care physician if you do not have one) for your aftercare needs so that they can reassess your need for medications and monitor your lab values. You Must read complete instructions/literature along with all the possible adverse reactions/side effects for all the Medicines you take and that have been prescribed to you. Take any new Medicines after you have completely understood and accept all the possible adverse reactions/side effects. Wear Seat belts while driving. If you have smoked or chewed Tobacco in the last 2 yrs please stop smoking and/or stop any Recreational drug use.   Increase activity slowly    Complete by:  As directed    Weight bearing as tolerated    Complete by:  As directed      Discharge Exam: Filed Weights   06/30/16 1139  Weight: 65.3 kg (144 lb)   Vitals:   07/02/16 0615 07/02/16 1356  BP: (!) 147/63 (!) 119/48  Pulse: 86 84  Resp: 16 16  Temp: 98.9 F (  37.2 C) 100.2 F (37.9 C)   General: Appear in no distress, no Rash; Oral Mucosa moist. Cardiovascular: S1 and S2 Present, no Murmur, no JVD Respiratory: Bilateral Air entry present and Clear to Auscultation no Crackles, no wheezes Abdomen: Bowel Sound present, Soft and no tenderness Extremities: no Pedal edema, no calf tenderness Neurology: Grossly no focal neuro deficit.  The results of significant diagnostics from this hospitalization (including imaging, microbiology, ancillary and laboratory) are listed below for reference.    Significant Diagnostic Studies: Ct Head Wo Contrast  Result Date: 07/01/2016 CLINICAL DATA:  Unwitnessed fall. Unsure of loss of consciousness. No obvious signs of injury. EXAM: CT HEAD WITHOUT CONTRAST CT CERVICAL SPINE WITHOUT CONTRAST TECHNIQUE: Multidetector CT imaging of the head and cervical spine was performed following the  standard protocol without intravenous contrast. Multiplanar CT image reconstructions of the cervical spine were also generated. COMPARISON:  02/05/2012 FINDINGS: CT HEAD FINDINGS Brain: No intracranial hemorrhage. No parenchymal contusion. No midline shift or mass effect. Basilar cisterns are patent. No skull base fracture. No fluid in the paranasal sinuses or mastoid air cells. Orbits are normal. Adrenals cord atrophy and periventricular white matter microvascular disease noted. Vascular: No hyperdense vessel or unexpected calcification. Skull: Normal. Negative for fracture or focal lesion. Sinuses/Orbits: Paranasal sinuses and mastoid air cells are clear. Orbits are clear. Other: None. CT CERVICAL SPINE FINDINGS Alignment: Normal alignment of vertebral bodies. Skull base and vertebrae: Normal craniocervical junction. No loss vertebral body height and disc height. Normal facet articulation. Soft tissues and spinal canal: No epidural paraspinal hematoma. Disc levels:  Multilevel disc osteophytic disease. Upper chest: Unremarkable Other: None IMPRESSION: 1. No intracranial trauma. 2. Atrophy and  white matter microvascular disease. 3. No cervical spine fracture. 4. Multilevel disc osteophytic disease. Electronically Signed   By: Genevive Bi M.D.   On: 07/01/2016 10:21   Ct Cervical Spine Wo Contrast  Result Date: 07/01/2016 CLINICAL DATA:  Unwitnessed fall. Unsure of loss of consciousness. No obvious signs of injury. EXAM: CT HEAD WITHOUT CONTRAST CT CERVICAL SPINE WITHOUT CONTRAST TECHNIQUE: Multidetector CT imaging of the head and cervical spine was performed following the standard protocol without intravenous contrast. Multiplanar CT image reconstructions of the cervical spine were also generated. COMPARISON:  02/05/2012 FINDINGS: CT HEAD FINDINGS Brain: No intracranial hemorrhage. No parenchymal contusion. No midline shift or mass effect. Basilar cisterns are patent. No skull base fracture. No fluid in  the paranasal sinuses or mastoid air cells. Orbits are normal. Adrenals cord atrophy and periventricular white matter microvascular disease noted. Vascular: No hyperdense vessel or unexpected calcification. Skull: Normal. Negative for fracture or focal lesion. Sinuses/Orbits: Paranasal sinuses and mastoid air cells are clear. Orbits are clear. Other: None. CT CERVICAL SPINE FINDINGS Alignment: Normal alignment of vertebral bodies. Skull base and vertebrae: Normal craniocervical junction. No loss vertebral body height and disc height. Normal facet articulation. Soft tissues and spinal canal: No epidural paraspinal hematoma. Disc levels:  Multilevel disc osteophytic disease. Upper chest: Unremarkable Other: None IMPRESSION: 1. No intracranial trauma. 2. Atrophy and  white matter microvascular disease. 3. No cervical spine fracture. 4. Multilevel disc osteophytic disease. Electronically Signed   By: Genevive Bi M.D.   On: 07/01/2016 10:21   Chest Portable 1 View  Result Date: 06/30/2016 CLINICAL DATA:  Unwitnessed fall. EXAM: PORTABLE CHEST 1 VIEW COMPARISON:  None. FINDINGS: The heart size and mediastinal contours are within normal limits. Both lungs are clear. No pneumothorax or pleural effusion is noted. Atherosclerosis of thoracic aorta is  noted. The visualized skeletal structures are unremarkable. IMPRESSION: No acute cardiopulmonary abnormality seen.  Aortic atherosclerosis. Electronically Signed   By: Lupita RaiderJames  Green Jr, M.D.   On: 06/30/2016 17:51   Dg C-arm 1-60 Min  Result Date: 07/01/2016 CLINICAL DATA:  Status post total hip replacement EXAM: OPERATIVE RIGHT HIP   1 VIEW TECHNIQUE: Fluoroscopic spot image(s) were submitted for interpretation post-operatively. COMPARISON:  None. FLUOROSCOPY TIME:  0 minutes 8 seconds; 2 acquired images FINDINGS: Frontal image shows a total hip replacement on the right with prosthetic components well-seated on single view. No fracture or dislocation evident. Left  hip joint is visualized and appears unremarkable. IMPRESSION: Status post total hip replacement on the right with prosthetic component well-seated on frontal view. No acute fracture or dislocation evident. Electronically Signed   By: Bretta BangWilliam  Woodruff III M.D.   On: 07/01/2016 16:55   Dg Hip Operative Unilat W Or W/o Pelvis Right  Result Date: 07/01/2016 CLINICAL DATA:  Status post total hip replacement EXAM: OPERATIVE RIGHT HIP   1 VIEW TECHNIQUE: Fluoroscopic spot image(s) were submitted for interpretation post-operatively. COMPARISON:  None. FLUOROSCOPY TIME:  0 minutes 8 seconds; 2 acquired images FINDINGS: Frontal image shows a total hip replacement on the right with prosthetic components well-seated on single view. No fracture or dislocation evident. Left hip joint is visualized and appears unremarkable. IMPRESSION: Status post total hip replacement on the right with prosthetic component well-seated on frontal view. No acute fracture or dislocation evident. Electronically Signed   By: Bretta BangWilliam  Woodruff III M.D.   On: 07/01/2016 16:55   Dg Hip Unilat  With Pelvis 2-3 Views Right  Result Date: 06/30/2016 CLINICAL DATA:  Deformity of the right lower extremity after a fall from bed this morning. EXAM: DG HIP (WITH OR WITHOUT PELVIS) 2-3V RIGHT COMPARISON:  None. FINDINGS: There is an angulated overriding subcapital fracture of the proximal right femoral neck. Pelvic bones are intact. IMPRESSION: Angulated overriding subcapital fracture of the right femoral neck. Electronically Signed   By: Francene BoyersJames  Maxwell M.D.   On: 06/30/2016 12:19    Microbiology: Recent Results (from the past 240 hour(s))  Surgical pcr screen     Status: Abnormal   Collection Time: 06/30/16  6:44 PM  Result Value Ref Range Status   MRSA, PCR NEGATIVE NEGATIVE Final   Staphylococcus aureus POSITIVE (A) NEGATIVE Final    Comment:        The Xpert SA Assay (FDA approved for NASAL specimens in patients over 67 years of age), is  one component of a comprehensive surveillance program.  Test performance has been validated by [facility] for patients greater than or equal to 67 year old. It is not intended to diagnose infection nor to guide or monitor treatment.      Labs: CBC:  Recent Labs Lab 06/30/16 1243 07/01/16 0530 07/02/16 0528  WBC 12.4* 8.6 9.8  NEUTROABS 11.4*  --   --   HGB 12.3 10.9* 10.3*  HCT 37.9 34.5* 32.7*  MCV 82.9 84.8 85.6  PLT 231 207 181   Basic Metabolic Panel:  Recent Labs Lab 06/30/16 1243 07/01/16 0530 07/02/16 0528  NA 138 139 137  K 4.0 4.0 3.7  CL 103 108 105  CO2 28 26 24   GLUCOSE 165* 120* 101*  BUN 15 15 10   CREATININE 0.68 0.89 0.69  CALCIUM 9.4 8.9 8.4*  MG  --   --  1.5*   Cardiac Enzymes:  Recent Labs Lab 06/30/16 1243  CKTOTAL 194  Time spent: 30 minutes  Signed:  Jobany Montellano  [facility]  07/02/2016  , 2:00 PM

## 2016-07-02 NOTE — Progress Notes (Signed)
OT Cancellation Note  Patient Details Name: [name] MRN: [id number] DOB: 1950/12/26   Cancelled Treatment:    Reason Eval/Treat Not Completed: Other (comment)  Pt with severe dementia.  Do not feel OT appropriate at this time. Lise AuerLori Burnetta Kohls, [place]OT [phone]  Einar CrowEDDING, [name] 07/02/2016, 10:41 AM

## 2016-07-02 NOTE — Addendum Note (Signed)
Addendum  created 07/02/16 1142 by Dorris Singhharlene Kayleb Warshaw, MD   Order sets accessed

## 2016-07-02 NOTE — Clinical Social Work Placement (Signed)
   CLINICAL SOCIAL WORK PLACEMENT  NOTE  Date:  07/02/2016  Patient Details  Name: Paula ChestnutMary R Chambers MRN: [id number] Date of Birth: 17-May-1950  Clinical Social Work is seeking post-discharge placement for this patient at the Skilled  Nursing Facility level of care (*CSW will initial, date and re-position this form in  chart as items are completed):      Patient/family provided with [facility] Clinical Social Work Department's list of facilities offering this level of care within the geographic area requested by the patient (or if unable, by the patient's family).      Patient/family informed of their freedom to choose among providers that offer the needed level of care, that participate in Medicare, Medicaid or managed care program needed by the patient, have an available bed and are willing to accept the patient.      Patient/family informed of [facility]'s ownership interest in [facility] and [facility], as well as of the fact that they are under no obligation to receive care at these facilities.  PASRR submitted to EDS on       PASRR number received on 07/01/16     Existing PASRR number confirmed on       FL2 transmitted to all facilities in geographic area requested by pt/family on 07/01/16     FL2 transmitted to all facilities within larger geographic area on       Patient informed that his/her managed care company has contracts with or will negotiate with certain facilities, including the following:        Yes   Patient/family informed of bed offers received.  [facility]     Physician recommends and patient chooses bed at      [facility].  Patient to be transferred to facility by [facility]     Patient family notified on 07/02/16 of transfer.  Name of family member notified:  [facility]     PHYSICIAN       Additional Comment:    _______________________________________________ Volney AmericanBridget A Mayton,  LCSW 07/02/2016, 2:55 PM

## 2016-07-02 NOTE — Progress Notes (Signed)
[facility] to be [facility]. IV removed. [facility] here for transport. Nurse called and gave [facility].

## 2016-07-02 NOTE — Clinical Social Work Note (Signed)
Clinical Social Worker facilitated patient discharge including contacting patient family and facility to confirm patient discharge plans.  Clinical information faxed to facility and family agreeable with plan.  CSW arranged ambulance transport via [facility].  RN to call [phone] for report prior to discharge. [facility].  Clinical Social Worker will sign off for now as social work intervention is no longer needed. Please consult us again if new need arises.  [address] Lantern DriveBridget Pittman, ConnecticutLCSWA [phone]

## 2017-07-18 IMAGING — CT CT CERVICAL SPINE W/O CM
2 of 8 series · 6 of 33 positions shown, 7 images · non-contrast
Comparison: 02/05/2012

CLINICAL DATA: Unwitnessed fall. Unsure of loss of consciousness.
No obvious signs of injury.

EXAM:
CT HEAD WITHOUT CONTRAST
CT CERVICAL SPINE WITHOUT CONTRAST
TECHNIQUE: Multidetector CT imaging of the head and cervical spine was
performed following the standard protocol without intravenous
contrast. Multiplanar CT image reconstructions of the cervical spine
were also generated.

[Series 304: axial · axial · 0.30mm/px · z∈[+131,+222]mm · 3 of 95 slices shown, 4 images]
[im 24/95  soft-tissue]
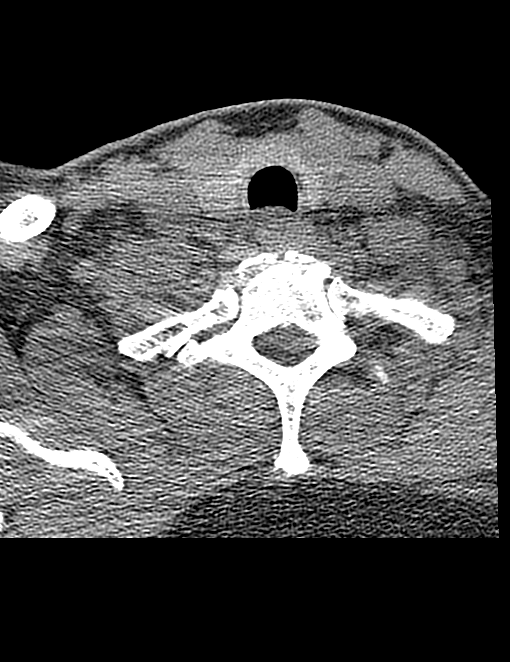
[im 24/95  bone]
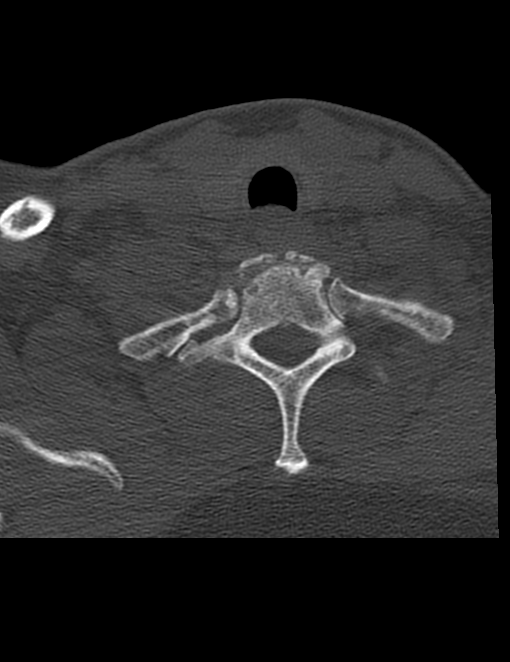
[im 48/95  bone]
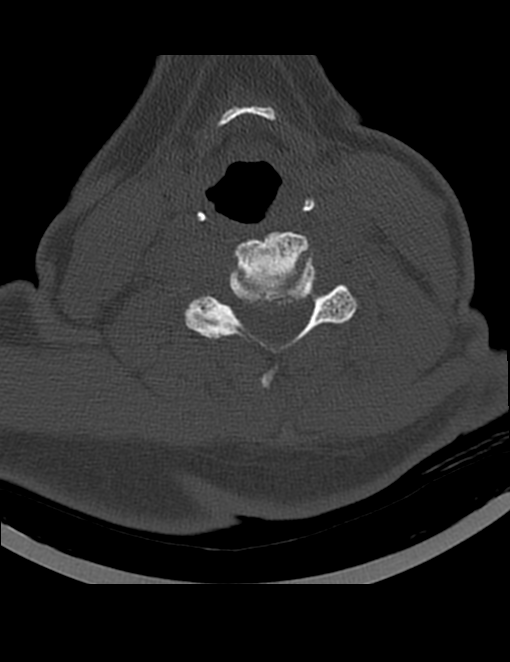
[im 71/95  bone]
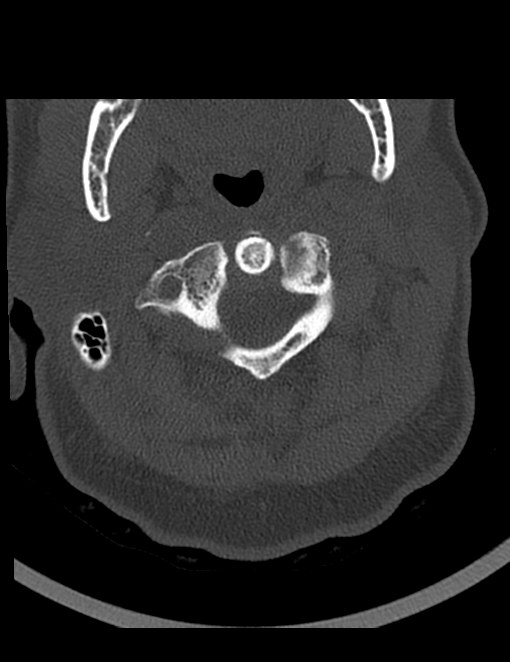

[Series 306: sagittal · sagittal · 0.30mm/px · 3 of 48 slices shown]
[im 12/48  bone]
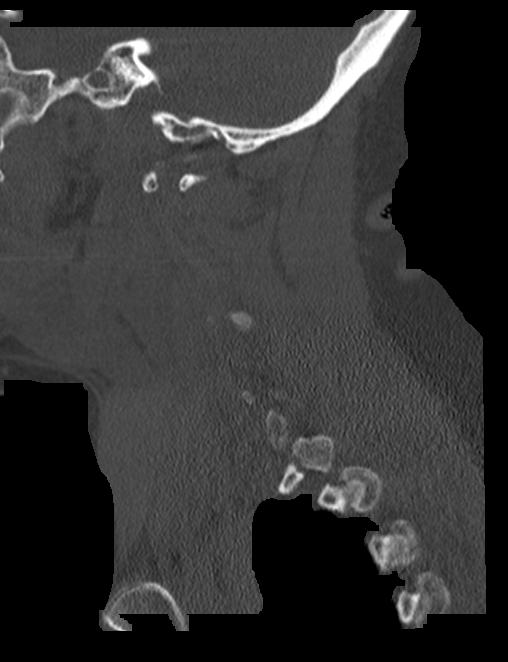
[im 24/48  bone]
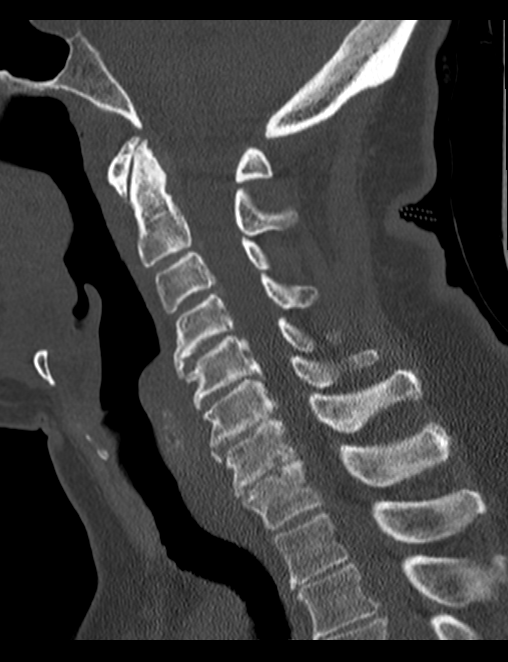
[im 36/48  bone]
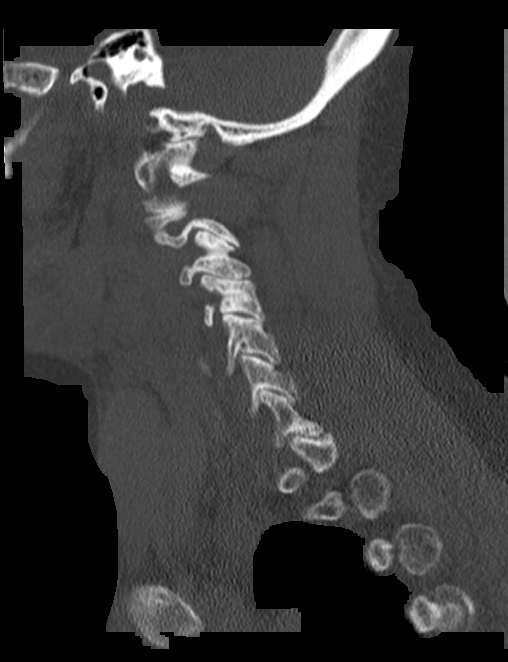

[6 of 33 positions shown; findings below may reference images not displayed]

FINDINGS: CT HEAD FINDINGS

Brain: No intracranial hemorrhage. No parenchymal contusion. No
midline shift or mass effect. Basilar cisterns are patent. No skull
base fracture. No fluid in the paranasal sinuses or mastoid air
cells. Orbits are normal.

Adrenals cord atrophy and periventricular white matter microvascular
disease noted.

Vascular: No hyperdense vessel or unexpected calcification.

Skull: Normal. Negative for fracture or focal lesion.

Sinuses/Orbits: Paranasal sinuses and mastoid air cells are clear.
Orbits are clear.

Other: None.

CT CERVICAL SPINE FINDINGS

Alignment: Normal alignment of vertebral bodies.

Skull base and vertebrae: Normal craniocervical junction. No loss
vertebral body height and disc height. Normal facet articulation.

Soft tissues and spinal canal: No epidural paraspinal hematoma.

Disc levels:  Multilevel disc osteophytic disease.

Upper chest: Unremarkable

Other: None
IMPRESSION: 1. No intracranial trauma.
2. Atrophy and  white matter microvascular disease.
3. No cervical spine fracture.
4. Multilevel disc osteophytic disease.

## 2017-11-01 DEATH — deceased
